# Patient Record
Sex: Male | Born: 1940 | Race: White | Hispanic: No | Marital: Married | Smoking: Former smoker
Health system: Southern US, Community
[De-identification: ages and names within clinical notes are randomized; demographics above are authoritative.]

## PROBLEM LIST (undated history)

## (undated) DIAGNOSIS — C679 Malignant neoplasm of bladder, unspecified: Secondary | ICD-10-CM

## (undated) DIAGNOSIS — I714 Abdominal aortic aneurysm, without rupture, unspecified: Secondary | ICD-10-CM

## (undated) DIAGNOSIS — R609 Edema, unspecified: Secondary | ICD-10-CM

## (undated) DIAGNOSIS — E782 Mixed hyperlipidemia: Secondary | ICD-10-CM

## (undated) DIAGNOSIS — I4891 Unspecified atrial fibrillation: Secondary | ICD-10-CM

## (undated) DIAGNOSIS — I739 Peripheral vascular disease, unspecified: Secondary | ICD-10-CM

## (undated) DIAGNOSIS — J449 Chronic obstructive pulmonary disease, unspecified: Secondary | ICD-10-CM

## (undated) DIAGNOSIS — I10 Essential (primary) hypertension: Secondary | ICD-10-CM

## (undated) HISTORY — PX: TRANSURETHRAL RESECTION OF PROSTATE: SHX73

## (undated) HISTORY — DX: Essential (primary) hypertension: I10

## (undated) HISTORY — DX: Malignant neoplasm of bladder, unspecified: C67.9

## (undated) HISTORY — DX: Abdominal aortic aneurysm, without rupture: I71.4

## (undated) HISTORY — DX: Mixed hyperlipidemia: E78.2

## (undated) HISTORY — PX: TONSILLECTOMY: SUR1361

## (undated) HISTORY — DX: Peripheral vascular disease, unspecified: I73.9

## (undated) HISTORY — DX: Unspecified atrial fibrillation: I48.91

## (undated) HISTORY — DX: Abdominal aortic aneurysm, without rupture, unspecified: I71.40

---

## 2000-07-11 ENCOUNTER — Other Ambulatory Visit: Admission: RE | Admit: 2000-07-11 | Discharge: 2000-07-11 | Payer: Self-pay | Admitting: Urology

## 2000-08-15 ENCOUNTER — Encounter: Payer: Self-pay | Admitting: Urology

## 2000-08-15 ENCOUNTER — Ambulatory Visit (HOSPITAL_COMMUNITY): Admission: RE | Admit: 2000-08-15 | Discharge: 2000-08-15 | Payer: Self-pay | Admitting: Urology

## 2000-10-16 ENCOUNTER — Ambulatory Visit (HOSPITAL_COMMUNITY): Admission: RE | Admit: 2000-10-16 | Discharge: 2000-10-16 | Payer: Self-pay | Admitting: Urology

## 2001-02-13 ENCOUNTER — Other Ambulatory Visit: Admission: RE | Admit: 2001-02-13 | Discharge: 2001-02-13 | Payer: Self-pay | Admitting: Urology

## 2001-04-02 ENCOUNTER — Ambulatory Visit (HOSPITAL_COMMUNITY): Admission: AD | Admit: 2001-04-02 | Discharge: 2001-04-02 | Payer: Self-pay | Admitting: Urology

## 2002-01-28 ENCOUNTER — Ambulatory Visit (HOSPITAL_COMMUNITY): Admission: RE | Admit: 2002-01-28 | Discharge: 2002-01-28 | Payer: Self-pay | Admitting: Urology

## 2002-03-05 ENCOUNTER — Encounter: Payer: Self-pay | Admitting: Family Medicine

## 2002-03-05 ENCOUNTER — Ambulatory Visit (HOSPITAL_COMMUNITY): Admission: RE | Admit: 2002-03-05 | Discharge: 2002-03-05 | Payer: Self-pay | Admitting: Family Medicine

## 2004-06-16 ENCOUNTER — Ambulatory Visit (HOSPITAL_COMMUNITY): Admission: RE | Admit: 2004-06-16 | Discharge: 2004-06-16 | Payer: Self-pay | Admitting: Family Medicine

## 2004-06-22 ENCOUNTER — Ambulatory Visit: Payer: Self-pay

## 2005-09-19 ENCOUNTER — Ambulatory Visit (HOSPITAL_COMMUNITY): Admission: RE | Admit: 2005-09-19 | Discharge: 2005-09-19 | Payer: Self-pay | Admitting: Family Medicine

## 2010-02-15 ENCOUNTER — Ambulatory Visit (HOSPITAL_COMMUNITY)
Admission: RE | Admit: 2010-02-15 | Discharge: 2010-02-15 | Payer: Self-pay | Source: Home / Self Care | Attending: Internal Medicine | Admitting: Internal Medicine

## 2010-06-18 NOTE — H&P (Signed)
[facility]  Patient:    Jesse Gallagher, Jesse Gallagher Visit Number: [id number] MRN: [id number]          Service Type: DSU Location: DAY Attending Physician:  Dennie Maizes Dictated by:   Dennie Maizes, M.D. Admit Date:  04/02/2001 Discharge Date: 04/02/2001   CC:         Karleen Hampshire, M.D.   History and Physical  CHIEF COMPLAINT:  Recurrent bladder tumor.  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  He has been treated with intravesical BCG.  Cystoscopy, multiple bladder biopsies and TUR of the bladder tumor was done in September 2002.  He has done well after the surgery.  He is not having any voiding difficulty or gross hematuria at present.  Urine cytology revealed no evidence of malignancy.  Cystoscopy in the office revealed recurrent bladder tumor in the right ureteral orifice. The patient is brought to the hospital today for cystoscopy, multiple bladder biopsies and transurethral resection of the bladder tumor.  PAST MEDICAL HISTORY: 1. 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 2. History of essential hypertension. 3. Status post tonsillectomy.  MEDICATIONS:  Ziac 2.5 mg one p.o. q.d.  ALLERGIES:  None.  FAMILY HISTORY:  Positive for coronary artery disease and urolithiasis.  PHYSICAL EXAMINATION:  HEENT:  Normal.  NECK:  No masses.  LUNGS:  Clear to auscultation.  HEART:  Regular rate and rhythm, no murmurs.  ABDOMEN:  Soft, nonpalpable mass.  No costovertebral angle tenderness. Bladder is not palpable.  GENITALIA:  Penis and testes are normal.  IMPRESSION: 1. Recurrent bladder tumor. 2. History of carcinoma of the bladder.  PLAN:  Cystoscopy, multiple bladder biopsies, and transurethral resection of the bladder tumor under anesthesia in the hospital.  I have discussed with  the patient regarding the diagnosis, operative details, alternative treatments, also possible risks and complications and is agreed for the procedure to be done. Dictated by:   Dennie Maizes, M.D. Attending Physician:  Dennie Maizes DD:  04/01/01 TD:  04/01/01 Job: [id number]

## 2010-06-18 NOTE — Op Note (Signed)
NAME:  Jesse Gallagher, Jesse Gallagher                        ACCOUNT NO.:  [id number]   MEDICAL RECORD NO.:  [id number]                   PATIENT TYPE:  AMB   LOCATION:  DAY                                  FACILITY:  [facility]   PHYSICIAN:  Dennie Maizes, M.D.                DATE OF BIRTH:  Apr 19, 1940   DATE OF PROCEDURE:  01/28/2002  DATE OF DISCHARGE:                                 OPERATIVE REPORT   PREOPERATIVE DIAGNOSES:  Recurrent bladder tumors, carcinoma of the bladder.   POSTOPERATIVE DIAGNOSES:  Recurrent bladder tumors, carcinoma of the  bladder.   PROCEDURE:  Cystoscopy, multiple bladder biopsies and transverse of  secondary bladder tumor (2 cm).   SURGEON:  Dennie Maizes, M.D.   ANESTHESIA:  Spinal   COMPLICATIONS:  None   ESTIMATED BLOOD LOSS:  Minimal   SPECIMENS:  Bladder biopsies and bladder tumor.   DRAINS:  11 French Foley catheter in the bladder.   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  He had  bladder tumor recurrences during a recent cystoscopy.  He was taken to the  operating room today for cystoscopy, multiple bladder biopsies and  transverse resection of bladder tumor.   DESCRIPTION OF PROCEDURE:  Spinal anesthesia was induced and the patient was  placed on the OR table in the dorsal lithotomy position.  The lower abdomen  and genitalia were prepped and draped in a sterile fashion.  Cystoscopy was  done with the 25 [place] scope.  The urethra was normal.  There was moderate  hypertrophy involving both left lobes of the prostate without significant  bladder neck obstruction.  The bladder was then examined.  There was a 2-cm  sized papillary tumor surrounding the right ureteral orifice.  There were no  other abnormalities inside the bladder.  With rigid biopsy forceps mucosal  biopsies are done from the right lateral wall, posterior wall, left lateral  wall and  trigone of the bladder.  The cystoscope was then removed. The  urethral was dilated up to 30 [place] with [employer] sound.  A 28 French  Iglesias resectoscope with continuous bladder irrigation was then inserted  into the bladder.  Biopsy sites were first examined and fulgurated.  The  bladder tumor was then resected up to the muscle base.  Care was taken not  to fulgurate the ureteral orifice.  Fulguration of the peripheral part of  the tumor was done to obtain hemostasis.  There was no active bleeding at  this time.  The tumor pieces were removed and sent for histopathological  examination.  The instruments were removed.  An 65 French Foley catheter was  inserted into the bladder.  Bladder irrigation was done with saline and  irrigation was clear.  The patient was transferred to the [facility] in a  satisfactory condition.  Dennie Maizes, M.D.    SK/MEDQ  D:  01/28/2002  T:  01/28/2002  Job:  [id number]   cc:   Kirk Ruths, M.D.  [address]  [place]  [place] [address]  Fax: [phone]

## 2010-06-18 NOTE — H&P (Signed)
   NAME:  Jesse Gallagher, Jesse Gallagher                        ACCOUNT NO.:  [id number]   MEDICAL RECORD NO.:  [id number]                   PATIENT TYPE:  AMB   LOCATION:  DAY                                  FACILITY:  [facility]   PHYSICIAN:  Dennie Maizes, M.D.                DATE OF BIRTH:  06-08-1940   DATE OF ADMISSION:  01/28/2002  DATE OF DISCHARGE:                                HISTORY & PHYSICAL   CHIEF COMPLAINT:  Recurrent bladder tumors.   HISTORY OF PRESENT ILLNESS:  This 70 year old male has a history of  recurrent superficial carcinoma of the bladder.  He was initially evaluated  for gross hematuria in March 2001; there were bladder tumors.  He has  undergone multiple bladder biopsies and resection of bladder tumor.  Last  bladder tumor recurrence was in March 2003.  He also has been treated with  interval cycle of BCG.  He underwent follow up cystoscopy in October 2003 at  which time recurrent bladder tumors were noted.  The patient denies having  any voiding difficulty or gross hematuria at present.  His recent urine  cytology revealed no evidence of malignant cells.  The patient is brought to  the hospital today for a cystoscopy, multiple bladder biopsies and  transurethral resection of bladder tumors.   MEDICATIONS:  Ziac 2.5 mg p.o. q.d.   ALLERGIES:  None.   PAST MEDICAL HISTORY:  1. History of recurrent superficial transitional carcinoma of the bladder.  2. History of essential hypertension.  3. Status post tonsillectomy.   FAMILY HISTORY:  Positive for coronary artery disease and kidney stones.   PHYSICAL EXAMINATION:  HEAD, EYES, EARS, NOSE, AND THROAT:  Normal.  NECK:  No masses.  LUNGS:  Clear to auscultation.  HEART:  Regular rate and rhythm.  No murmurs.  ABDOMEN:  Soft, no palpable recurrent mass. No costovertebral angle  tenderness.  Bladder is not palpable.  Kidneys and testes are normal.  RECTAL EXAMINATION:  Fifty-gram size benign prostate.   IMPRESSION:   Recurrent superficial bladder tumors.   PLAN:  Cystoscopy, multiple bladder biopsies and transurethral resection of  bladder tumors under anesthesia in the hospital.   I have discussed with the patient regarding the diagnosis, operative  details, alternative treatments, outcome, and possible risks and  complications; and, he has agreed for the procedure to be done.                                                 Dennie Maizes, M.D.    SK/MEDQ  D:  01/27/2002  T:  01/27/2002  Job:  [id number]   cc:   Kirk Ruths, M.D.  [address]  [place]  [place] [address]  Fax: [phone]

## 2010-06-18 NOTE — Op Note (Signed)
[facility]  Patient:    Jesse Gallagher, Jesse Gallagher Visit Number: [id number] MRN: [id number]          Service Type: DSU Location: DAY Attending Physician:  Dennie Maizes Dictated by:   Dennie Maizes, M.D. Proc. Date: 04/02/01 Admit Date:  04/02/2001   CC:         Karleen Hampshire, M.D.   Operative Report  PREOPERATIVE DIAGNOSIS:  Recurrent bladder tumor.  POSTOPERATIVE DIAGNOSIS:  Recurrent bladder tumor.  OPERATION:  Cystoscopy with multiple bladder biopsies and transurethral resection of bladder tumor, size 1.5 cm.  SURGEON:  Dennie Maizes, M.D.  ANESTHESIA:  General.  COMPLICATIONS:  None.  ESTIMATED BLOOD LOSS:  Minimal.  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  Repeat cystoscopy in the office revealed a bladder tumor recurrence.  The patient was taken to the OR today for cystoscopy, multiple bladder biopsies and transurethral resection of the bladder tumor.  DESCRIPTION OF PROCEDURE:  Spinal anesthesia was induced, and the patient was placed on the operating table in the dorsal lithotomy position.  The lower abdomen and genitalia were prepped and draped in a sterile fashion. Cystoscopy was done with a 24 [place] scope.  The uterine prostate was normal. The bladder was then examined.  There was papillary tumor about 1.5 cm in size around the right ureteral orifice.  The rest of the bladder mucosa was normal. With the rigid biopsy forceps, mucosal biopsies were done from the right lateral wall, posterior wall, left lateral wall, and trigone of the bladder. The cystoscope was removed.  The ureter was dilated up to 30 [place] with [employer] sounds.  A 28 [place] Iglesias resectoscope with continuous bladder insufflation was then inserted into the bladder.  The biopsy sites were first examined and fulgurated.  The tumor around the right ureteral orifice was then  resected up to the muscle bellies.  The ureteral orifice could be seen clearly in the center of the base of the tumor.  There was efflux of clear urine from the ureteral orifice.  The bleeding points were fulgurated and complete hemostasis was obtained.  The tumor pieces were obtained and sent for histopathological examination.  The instruments were removed.  An 67 French Foley catheter was inserted into the bladder.  The patient was transferred to the PACU in satisfactory condition. Dictated by:   Dennie Maizes, M.D. Attending Physician:  Dennie Maizes DD:  04/02/01 TD:  04/02/01 Job: [id number]

## 2012-02-08 ENCOUNTER — Encounter (HOSPITAL_COMMUNITY): Payer: Self-pay | Admitting: Pharmacy Technician

## 2012-02-08 NOTE — Patient Instructions (Addendum)
Your procedure is scheduled on:  02/14/2012  Report to [name] at 6:15     AM.  Call this number if you have problems the morning of surgery: [phone]   Remember:   Do not eat or drink :After Midnight.    Take these medicines the morning of surgery with A SIP OF WATER:Use Albuterol inhaler, take  Ziac, Spiriva, Prednisone   Do not wear jewelry, make-up or nail polish.  Do not wear lotions, powders, or perfumes. You may wear deodorant.  Do not shave 48 hours prior to surgery.  Do not bring valuables to the hospital.  Contacts, dentures or bridgework may not be worn into surgery.  Patients discharged the day of surgery will not be allowed to drive home.  Name and phone number of your driver:    Please read over the following fact sheets that you were given: Pain Booklet, Surgical Site Infection Prevention, Anesthesia Post-op Instructions and Care and Recovery After Surgery  Cataract Surgery  A cataract is a clouding of the lens of the eye. When a lens becomes cloudy, vision is reduced based on the degree and nature of the clouding. Surgery may be needed to improve vision. Surgery removes the cloudy lens and usually replaces it with a substitute lens (intraocular lens, IOL). LET YOUR EYE DOCTOR KNOW ABOUT:  Allergies to food or medicine.   Medicines taken including herbs, eyedrops, over-the-counter medicines, and creams.   Use of steroids (by mouth or creams).   Previous problems with anesthetics or numbing medicine.   History of bleeding problems or blood clots.   Previous surgery.   Other health problems, including diabetes and kidney problems.   Possibility of pregnancy, if this applies.  RISKS AND COMPLICATIONS  Infection.   Inflammation of the eyeball (endophthalmitis) that can spread to both eyes (sympathetic ophthalmia).   Poor wound healing.   If an IOL is inserted, it can later fall out of proper position. This is very uncommon.   Clouding of the part of  your eye that holds an IOL in place. This is called an "after-cataract." These are uncommon, but easily treated.  BEFORE THE PROCEDURE  Do not eat or drink anything except small amounts of water for 8 to 12 before your surgery, or as directed by your caregiver.   Unless you are told otherwise, continue any eyedrops you have been prescribed.   Talk to your primary caregiver about all other medicines that you take (both prescription and non-prescription). In some cases, you may need to stop or change medicines near the time of your surgery. This is most important if you are taking blood-thinning medicine.Do not stop medicines unless you are told to do so.   Arrange for someone to drive you to and from the procedure.   Do not put contact lenses in either eye on the day of your surgery.  PROCEDURE There is more than one method for safely removing a cataract. Your doctor can explain the differences and help determine which is best for you. Phacoemulsification surgery is the most common form of cataract surgery.  An injection is given behind the eye or eyedrops are given to make this a painless procedure.   A small cut (incision) is made on the edge of the clear, dome-shaped surface that covers the front of the eye (cornea).   A tiny probe is painlessly inserted into the eye. This device gives off ultrasound waves that soften and break up the cloudy center of the  lens. This makes it easier for the cloudy lens to be removed by suction.   An IOL may be implanted.   The normal lens of the eye is covered by a clear capsule. Part of that capsule is intentionally left in the eye to support the IOL.   Your surgeon may or may not use stitches to close the incision.  There are other forms of cataract surgery that require a larger incision and stiches to close the eye. This approach is taken in cases where the doctor feels that the cataract cannot be easily removed using phacoemulsification. AFTER THE  PROCEDURE  When an IOL is implanted, it does not need care. It becomes a permanent part of your eye and cannot be seen or felt.   Your doctor will schedule follow-up exams to check on your progress.   Review your other medicines with your doctor to see which can be resumed after surgery.   Use eyedrops or take medicine as prescribed by your doctor.  Document Released: 01/06/2011 Document Reviewed: 01/03/2011 [facility] Patient Information 2012 [place].  .Cataract Surgery Care After Refer to this sheet in the next few weeks. These instructions provide you with information on caring for yourself after your procedure. Your caregiver may also give you more specific instructions. Your treatment has been planned according to current medical practices, but problems sometimes occur. Call your caregiver if you have any problems or questions after your procedure.  HOME CARE INSTRUCTIONS   Avoid strenuous activities as directed by your caregiver.   Ask your caregiver when you can resume driving.   Use eyedrops or other medicines to help healing and control pressure inside your eye as directed by your caregiver.   Only take over-the-counter or prescription medicines for pain, discomfort, or fever as directed by your caregiver.   Do not to touch or rub your eyes.   You may be instructed to use a protective shield during the first few days and nights after surgery. If not, wear sunglasses to protect your eyes. This is to protect the eye from pressure or from being accidentally bumped.   Keep the area around your eye clean and dry. Avoid swimming or allowing water to hit you directly in the face while showering. Keep soap and shampoo out of your eyes.   Do not bend or lift heavy objects. Bending increases pressure in the eye. You can walk, climb stairs, and do light household chores.   Do not put a contact lens into the eye that had surgery until your caregiver says it is okay to do so.    Ask your doctor when you can return to work. This will depend on the kind of work that you do. If you work in a dusty environment, you may be advised to wear protective eyewear for a period of time.   Ask your caregiver when it will be safe to engage in sexual activity.   Continue with your regular eye exams as directed by your caregiver.  What to expect:  It is normal to feel itching and mild discomfort for a few days after cataract surgery. Some fluid discharge is also common, and your eye may be sensitive to light and touch.   After 1 to 2 days, even moderate discomfort should disappear. In most cases, healing will take about 6 weeks.   If you received an intraocular lens (IOL), you may notice that colors are very bright or have a blue tinge. Also, if you have been  in bright sunlight, everything may appear reddish for a few hours. If you see these color tinges, it is because your lens is clear and no longer cloudy. Within a few months after receiving an IOL, these extra colors should go away. When you have healed, you will probably need new glasses.  SEEK MEDICAL CARE IF:   You have increased bruising around your eye.   You have discomfort not helped by medicine.  SEEK IMMEDIATE MEDICAL CARE IF:   You have a fever.   You have a worsening or sudden vision loss.   You have redness, swelling, or increasing pain in the eye.   You have a thick discharge from the eye that had surgery.  MAKE SURE YOU:  Understand these instructions.   Will watch your condition.   Will get help right away if you are not doing well or get worse.  Document Released: 08/06/2004 Document Revised: 01/06/2011 Document Reviewed: 09/10/2010 [facility] Patient Information 2012 [place].

## 2012-02-09 ENCOUNTER — Encounter (HOSPITAL_COMMUNITY)
Admission: RE | Admit: 2012-02-09 | Discharge: 2012-02-09 | Disposition: A | Discharge status: Home / Self Care | Payer: Medicare Other | Source: Ambulatory Visit | Attending: Ophthalmology | Admitting: Ophthalmology

## 2012-02-09 ENCOUNTER — Encounter (HOSPITAL_COMMUNITY): Payer: Self-pay

## 2012-02-09 HISTORY — DX: Chronic obstructive pulmonary disease, unspecified: J44.9

## 2012-02-09 LAB — BASIC METABOLIC PANEL
BUN: 33 mg/dL — ABNORMAL HIGH (ref 6–23)
Creatinine, Ser: 1.56 mg/dL — ABNORMAL HIGH (ref 0.50–1.35)
GFR calc Af Amer: 49 mL/min — ABNORMAL LOW (ref >90)
GFR calc non Af Amer: 42 mL/min — ABNORMAL LOW (ref >90)

## 2012-02-09 NOTE — Pre-Procedure Instructions (Signed)
Patient in for PAT visit. EKG shows AFib. Called Dr Brita Romp. No previous history of AFib.Patient given copy of EKG and sent straight to see Dr Juanetta Gosling. Dr Juanetta Gosling to let us know whether to procede with surgery or not. Dr Jayme Cloud also aware.

## 2012-02-13 MED ORDER — CYCLOPENTOLATE-PHENYLEPHRINE 0.2-1 % OP SOLN
OPHTHALMIC | Status: AC
  Filled 2012-02-13: qty 2

## 2012-02-13 MED ORDER — TETRACAINE HCL 0.5 % OP SOLN
OPHTHALMIC | Status: AC
  Filled 2012-02-13: qty 2

## 2012-02-13 MED ORDER — PHENYLEPHRINE HCL 2.5 % OP SOLN
OPHTHALMIC | Status: AC
  Filled 2012-02-13: qty 2

## 2012-02-13 MED ORDER — FLURBIPROFEN SODIUM 0.03 % OP SOLN
OPHTHALMIC | Status: AC
  Filled 2012-02-13: qty 2.5

## 2012-02-13 NOTE — OR Nursing (Signed)
Per pt. He saw Dr. Juanetta Gosling and he looked at his ekg. Per pt. He said it was ok to proceed with surgery. Called Dr. Juanetta Gosling office and informed that we need a note to proceed with surgery. Dr. Juanetta Gosling to fax a note to Day Surgery. [phone]

## 2012-02-14 ENCOUNTER — Encounter (HOSPITAL_COMMUNITY)
Admission: RE | Disposition: A | Discharge status: Home / Self Care | Payer: Self-pay | Source: Ambulatory Visit | Attending: Ophthalmology

## 2012-02-14 ENCOUNTER — Encounter (HOSPITAL_COMMUNITY): Payer: Self-pay | Admitting: Anesthesiology

## 2012-02-14 ENCOUNTER — Ambulatory Visit (HOSPITAL_COMMUNITY)
Admission: RE | Admit: 2012-02-14 | Discharge: 2012-02-14 | Disposition: A | Discharge status: Home / Self Care | Payer: Medicare Other | Source: Ambulatory Visit | Attending: Ophthalmology | Admitting: Ophthalmology

## 2012-02-14 ENCOUNTER — Encounter (HOSPITAL_COMMUNITY): Payer: Self-pay | Admitting: *Deleted

## 2012-02-14 ENCOUNTER — Ambulatory Visit (HOSPITAL_COMMUNITY): Payer: Medicare Other | Admitting: Anesthesiology

## 2012-02-14 DIAGNOSIS — I10 Essential (primary) hypertension: Secondary | ICD-10-CM | POA: Insufficient documentation

## 2012-02-14 DIAGNOSIS — J4489 Other specified chronic obstructive pulmonary disease: Secondary | ICD-10-CM | POA: Insufficient documentation

## 2012-02-14 DIAGNOSIS — Z01812 Encounter for preprocedural laboratory examination: Secondary | ICD-10-CM | POA: Insufficient documentation

## 2012-02-14 DIAGNOSIS — H2510 Age-related nuclear cataract, unspecified eye: Secondary | ICD-10-CM | POA: Insufficient documentation

## 2012-02-14 DIAGNOSIS — J449 Chronic obstructive pulmonary disease, unspecified: Secondary | ICD-10-CM | POA: Insufficient documentation

## 2012-02-14 HISTORY — PX: CATARACT EXTRACTION W/PHACO: SHX586

## 2012-02-14 SURGERY — PHACOEMULSIFICATION, CATARACT, WITH IOL INSERTION
Anesthesia: Monitor Anesthesia Care | Site: Eye | Laterality: Right | Wound class: Clean

## 2012-02-14 MED ORDER — METOPROLOL TARTRATE 1 MG/ML IV SOLN
INTRAVENOUS | Status: AC
  Filled 2012-02-14: qty 5

## 2012-02-14 MED ORDER — MIDAZOLAM HCL 2 MG/2ML IJ SOLN
INTRAMUSCULAR | Status: AC
  Filled 2012-02-14: qty 2

## 2012-02-14 MED ORDER — CYCLOPENTOLATE-PHENYLEPHRINE 0.2-1 % OP SOLN
1.0000 [drp] | OPHTHALMIC | Status: AC
  Administered 2012-02-14 (×3): 1 [drp] via OPHTHALMIC

## 2012-02-14 MED ORDER — METOPROLOL TARTRATE 1 MG/ML IV SOLN
5.0000 mg | Freq: Once | INTRAVENOUS | Status: AC
  Administered 2012-02-14: 5 mg via INTRAVENOUS

## 2012-02-14 MED ORDER — PHENYLEPHRINE HCL 2.5 % OP SOLN
1.0000 [drp] | OPHTHALMIC | Status: AC
  Administered 2012-02-14 (×3): 1 [drp] via OPHTHALMIC

## 2012-02-14 MED ORDER — ONDANSETRON HCL 4 MG/2ML IJ SOLN: 4.0000 mg | Freq: Once | INTRAMUSCULAR | Status: DC | PRN

## 2012-02-14 MED ORDER — EPINEPHRINE HCL 1 MG/ML IJ SOLN
INTRAOCULAR | Status: DC | PRN
  Administered 2012-02-14: 08:00:00

## 2012-02-14 MED ORDER — PROVISC 10 MG/ML IO SOLN
INTRAOCULAR | Status: DC | PRN
  Administered 2012-02-14: 8.5 mg via INTRAOCULAR

## 2012-02-14 MED ORDER — FENTANYL CITRATE 0.05 MG/ML IJ SOLN: 25.0000 ug | INTRAMUSCULAR | Status: DC | PRN

## 2012-02-14 MED ORDER — LACTATED RINGERS IV SOLN
INTRAVENOUS | Status: DC | PRN
  Administered 2012-02-14: 07:00:00 via INTRAVENOUS

## 2012-02-14 MED ORDER — MIDAZOLAM HCL 2 MG/2ML IJ SOLN
1.0000 mg | INTRAMUSCULAR | Status: DC | PRN
  Administered 2012-02-14: 2 mg via INTRAVENOUS

## 2012-02-14 MED ORDER — TETRACAINE HCL 0.5 % OP SOLN
1.0000 [drp] | OPHTHALMIC | Status: AC
  Administered 2012-02-14 (×3): 1 [drp] via OPHTHALMIC

## 2012-02-14 MED ORDER — LABETALOL HCL 5 MG/ML IV SOLN
INTRAVENOUS | Status: AC
  Filled 2012-02-14: qty 4

## 2012-02-14 MED ORDER — FLURBIPROFEN SODIUM 0.03 % OP SOLN
1.0000 [drp] | OPHTHALMIC | Status: AC
  Administered 2012-02-14 (×3): 1 [drp] via OPHTHALMIC

## 2012-02-14 MED ORDER — LACTATED RINGERS IV SOLN
INTRAVENOUS | Status: DC
  Administered 2012-02-14: 1000 mL via INTRAVENOUS

## 2012-02-14 MED ORDER — BSS IO SOLN
INTRAOCULAR | Status: DC | PRN
  Administered 2012-02-14: 15 mL via INTRAOCULAR

## 2012-02-14 SURGICAL SUPPLY — 23 items

## 2012-02-14 NOTE — Progress Notes (Signed)
Dr Jayme Cloud in to assess pt. May be discharged now--per Dr Jayme Cloud. Pt to see Dr Juanetta Gosling and have echocardiogram this week.

## 2012-02-14 NOTE — Anesthesia Preprocedure Evaluation (Signed)
Anesthesia Evaluation  Patient identified by MRN, date of birth, ID band Patient awake    Reviewed: Allergy & Precautions, H&P , NPO status , Patient's Chart, lab work & pertinent test results  Airway Mallampati: I      Dental   Pulmonary COPD COPD inhaler,  breath sounds clear to auscultation        Cardiovascular hypertension, PND: new onset. + dysrhythmias Atrial Fibrillation Rhythm:Irregular Rate:Tachycardia     Neuro/Psych    GI/Hepatic   Endo/Other    Renal/GU      Musculoskeletal   Abdominal   Peds  Hematology   Anesthesia Other Findings   Reproductive/Obstetrics                           Anesthesia Physical Anesthesia Plan  ASA: III  Anesthesia Plan: MAC   Post-op Pain Management:    Induction: Intravenous  Airway Management Planned: Nasal Cannula  Additional Equipment:   Intra-op Plan:   Post-operative Plan:   Informed Consent: I have reviewed the patients History and Physical, chart, labs and discussed the procedure including the risks, benefits and alternatives for the proposed anesthesia with the patient or authorized representative who has indicated his/her understanding and acceptance.     Plan Discussed with:   Anesthesia Plan Comments:         Anesthesia Quick Evaluation

## 2012-02-14 NOTE — Preoperative (Addendum)
Beta Blockers   Reason not to administer Beta Blockers:Lopressor 5mg  IV at 0700.

## 2012-02-14 NOTE — Progress Notes (Signed)
Dr. Juanetta Gosling notified of persistant atrial fibrillationr rate in the 100's with hypertension. Dr. Jayme Cloud aware with medication(Lopressor) Given in pre-op as ordered and instructions to notifiy Dr. Juanetta Gosling. No further orders given by Dr. Juanetta Gosling. OK to proceed with surgery.

## 2012-02-14 NOTE — Anesthesia Procedure Notes (Signed)
Procedure Name: MAC Performed by: Carolyne Littles, AMY L Pre-anesthesia Checklist: Patient identified, Emergency Drugs available, Suction available, Timeout performed and Patient being monitored Patient Re-evaluated:Patient Re-evaluated prior to inductionOxygen Delivery Method: Nasal Cannula

## 2012-02-14 NOTE — Transfer of Care (Signed)
  Anesthesia Post-op Note  Patient: Jesse Gallagher  Procedure(s) Performed: Procedure(s) (LRB) with comments: CATARACT EXTRACTION PHACO AND INTRAOCULAR LENS PLACEMENT (IOC) (Right) - CDE:13.14  Patient Location: Short stay  Anesthesia Type: MAC  Level of Consciousness: awake, alert , oriented and patient cooperative  Airway and Oxygen Therapy: Patient Spontanous Breathing room air  Post-op Pain: mild  Post-op Assessment: Post-op Vital signs reviewed, Patient's Cardiovascular Status Stable, Respiratory Function Stable, Patent Airway and No signs of Nausea or vomiting  Post-op Vital Signs: Reviewed and stable  Complications: No apparent anesthesia complications

## 2012-02-14 NOTE — H&P (Signed)
The patient was re examined and there is no change in the patients condition since the original H and P. 

## 2012-02-14 NOTE — Anesthesia Postprocedure Evaluation (Signed)
  Anesthesia Post-op Note  Patient: Jesse Gallagher  Procedure(s) Performed: Procedure(s) (LRB) with comments: CATARACT EXTRACTION PHACO AND INTRAOCULAR LENS PLACEMENT (IOC) (Right) - CDE:13.14  Patient Location: Short stay  Anesthesia Type: MAC  Level of Consciousness: awake, alert , oriented and patient cooperative  Airway and Oxygen Therapy: Patient Spontanous Breathing room air  Post-op Pain: mild  Post-op Assessment: Post-op Vital signs reviewed, Patient's Cardiovascular Status Stable, Respiratory Function Stable, Patent Airway and No signs of Nausea or vomiting  Post-op Vital Signs: Reviewed and stable  Complications: No apparent anesthesia complications  

## 2012-02-14 NOTE — Op Note (Signed)
Patient brought to the operating room and prepped and draped in the usual manner.  Lid speculum inserted in right eye.  Stab incision made at the twelve o'clock position.  Provisc instilled in the anterior chamber.   A 2.4 mm. Stab incision was made temporally.  An anterior capsulotomy was done with a bent 25 gauge needle.  The nucleus was hydrodissected.  The Phaco tip was inserted in the anterior chamber and the nucleus was emulsified.  CDE was 13.14.  The cortical material was then removed with the I and A tip.  Posterior capsule was the polished.  The anterior chamber was deepened with Provisc.  A 16.0 Diopter Rayner 570C IOL was then inserted in the capsular bag.  Provisc was then removed with the I and A tip.  The wound was then hydrated.  Patient sent to the Recovery Room in good condition with follow up in my office.

## 2012-02-16 ENCOUNTER — Encounter (HOSPITAL_COMMUNITY): Payer: Self-pay | Admitting: Ophthalmology

## 2012-02-16 ENCOUNTER — Ambulatory Visit (HOSPITAL_COMMUNITY)
Admission: RE | Admit: 2012-02-16 | Discharge: 2012-02-16 | Disposition: A | Discharge status: Home / Self Care | Payer: Medicare Other | Source: Ambulatory Visit | Attending: Pulmonary Disease | Admitting: Pulmonary Disease

## 2012-02-16 DIAGNOSIS — I4891 Unspecified atrial fibrillation: Secondary | ICD-10-CM

## 2012-02-16 DIAGNOSIS — I10 Essential (primary) hypertension: Secondary | ICD-10-CM | POA: Insufficient documentation

## 2012-02-23 ENCOUNTER — Encounter (HOSPITAL_COMMUNITY): Payer: Self-pay | Admitting: Pharmacy Technician

## 2012-02-24 ENCOUNTER — Encounter (HOSPITAL_COMMUNITY): Payer: Self-pay

## 2012-02-24 ENCOUNTER — Encounter (HOSPITAL_COMMUNITY)
Admission: RE | Admit: 2012-02-24 | Discharge: 2012-02-24 | Disposition: A | Discharge status: Home / Self Care | Payer: Medicare Other | Source: Ambulatory Visit | Attending: Ophthalmology | Admitting: Ophthalmology

## 2012-02-24 NOTE — Patient Instructions (Signed)
Jesse Gallagher  02/24/2012   Your procedure is scheduled on:  02/28/2012  Report to [facility] at 7:00 AM.  Call this number if you have problems the morning of surgery: [phone]   Remember:   Do not eat food or drink liquids after midnight.   Take these medicines the morning of surgery with A SIP OF WATER: Cardizem and inhaler   Do not wear jewelry, make-up or nail polish.  Do not wear lotions, powders, or perfumes. You may wear deodorant.  Do not shave 48 hours prior to surgery. Men may shave face and neck.  Do not bring valuables to the hospital.  Contacts, dentures or bridgework may not be worn into surgery.  Leave suitcase in the car. After surgery it may be brought to your room.  For patients admitted to the hospital, checkout time is 11:00 AM the day of  discharge.   Patients discharged the day of surgery will not be allowed to drive  home.  Name and phone number of your driver: wife  Special Instructions: N/A   Please read over the following fact sheets that you were given: Care and Recovery After Surgery

## 2012-02-27 MED ORDER — TETRACAINE HCL 0.5 % OP SOLN
OPHTHALMIC | Status: AC
  Filled 2012-02-27: qty 2

## 2012-02-27 MED ORDER — FLURBIPROFEN SODIUM 0.03 % OP SOLN
OPHTHALMIC | Status: AC
  Filled 2012-02-27: qty 2.5

## 2012-02-27 MED ORDER — CYCLOPENTOLATE-PHENYLEPHRINE 0.2-1 % OP SOLN
OPHTHALMIC | Status: AC
  Filled 2012-02-27: qty 2

## 2012-02-27 MED ORDER — PHENYLEPHRINE HCL 2.5 % OP SOLN
OPHTHALMIC | Status: AC
  Filled 2012-02-27: qty 2

## 2012-02-28 ENCOUNTER — Encounter (HOSPITAL_COMMUNITY): Payer: Self-pay | Admitting: Anesthesiology

## 2012-02-28 ENCOUNTER — Encounter (HOSPITAL_COMMUNITY): Payer: Self-pay | Admitting: *Deleted

## 2012-02-28 ENCOUNTER — Encounter (HOSPITAL_COMMUNITY)
Admission: RE | Disposition: A | Discharge status: Home / Self Care | Payer: Self-pay | Source: Ambulatory Visit | Attending: Ophthalmology

## 2012-02-28 ENCOUNTER — Ambulatory Visit (HOSPITAL_COMMUNITY)
Admission: RE | Admit: 2012-02-28 | Discharge: 2012-02-28 | Disposition: A | Discharge status: Home / Self Care | Payer: Medicare Other | Source: Ambulatory Visit | Attending: Ophthalmology | Admitting: Ophthalmology

## 2012-02-28 ENCOUNTER — Ambulatory Visit (HOSPITAL_COMMUNITY): Payer: Medicare Other | Admitting: Anesthesiology

## 2012-02-28 DIAGNOSIS — H2510 Age-related nuclear cataract, unspecified eye: Secondary | ICD-10-CM | POA: Insufficient documentation

## 2012-02-28 DIAGNOSIS — J449 Chronic obstructive pulmonary disease, unspecified: Secondary | ICD-10-CM | POA: Insufficient documentation

## 2012-02-28 DIAGNOSIS — J4489 Other specified chronic obstructive pulmonary disease: Secondary | ICD-10-CM | POA: Insufficient documentation

## 2012-02-28 DIAGNOSIS — I10 Essential (primary) hypertension: Secondary | ICD-10-CM | POA: Insufficient documentation

## 2012-02-28 HISTORY — PX: CATARACT EXTRACTION W/PHACO: SHX586

## 2012-02-28 SURGERY — PHACOEMULSIFICATION, CATARACT, WITH IOL INSERTION
Anesthesia: Monitor Anesthesia Care | Site: Eye | Laterality: Left | Wound class: Clean

## 2012-02-28 MED ORDER — FENTANYL CITRATE 0.05 MG/ML IJ SOLN: 25.0000 ug | INTRAMUSCULAR | Status: DC | PRN

## 2012-02-28 MED ORDER — MIDAZOLAM HCL 2 MG/2ML IJ SOLN
INTRAMUSCULAR | Status: AC
  Filled 2012-02-28: qty 2

## 2012-02-28 MED ORDER — TETRACAINE HCL 0.5 % OP SOLN
1.0000 [drp] | OPHTHALMIC | Status: AC
  Administered 2012-02-28 (×3): 1 [drp] via OPHTHALMIC

## 2012-02-28 MED ORDER — ONDANSETRON HCL 4 MG/2ML IJ SOLN: 4.0000 mg | Freq: Once | INTRAMUSCULAR | Status: DC | PRN

## 2012-02-28 MED ORDER — MIDAZOLAM HCL 2 MG/2ML IJ SOLN
1.0000 mg | INTRAMUSCULAR | Status: DC | PRN
  Administered 2012-02-28: 2 mg via INTRAVENOUS

## 2012-02-28 MED ORDER — LACTATED RINGERS IV SOLN
INTRAVENOUS | Status: DC
  Administered 2012-02-28: 1000 mL via INTRAVENOUS

## 2012-02-28 MED ORDER — KETOROLAC TROMETHAMINE 0.5 % OP SOLN: 1.0000 [drp] | OPHTHALMIC | Status: DC

## 2012-02-28 MED ORDER — CYCLOPENTOLATE-PHENYLEPHRINE 0.2-1 % OP SOLN
1.0000 [drp] | OPHTHALMIC | Status: AC
  Administered 2012-02-28 (×3): 1 [drp] via OPHTHALMIC

## 2012-02-28 MED ORDER — BSS IO SOLN
INTRAOCULAR | Status: DC | PRN
  Administered 2012-02-28: 15 mL via INTRAOCULAR

## 2012-02-28 MED ORDER — EPINEPHRINE HCL 1 MG/ML IJ SOLN
INTRAMUSCULAR | Status: AC
  Filled 2012-02-28: qty 1

## 2012-02-28 MED ORDER — PROVISC 10 MG/ML IO SOLN
INTRAOCULAR | Status: DC | PRN
  Administered 2012-02-28: 8.5 mg via INTRAOCULAR

## 2012-02-28 MED ORDER — PHENYLEPHRINE HCL 2.5 % OP SOLN
1.0000 [drp] | OPHTHALMIC | Status: AC
  Administered 2012-02-28 (×3): 1 [drp] via OPHTHALMIC

## 2012-02-28 MED ORDER — EPINEPHRINE HCL 1 MG/ML IJ SOLN
INTRAOCULAR | Status: DC | PRN
  Administered 2012-02-28: 08:00:00

## 2012-02-28 MED ORDER — TETRACAINE 0.5 % OP SOLN OPTIME - NO CHARGE
OPHTHALMIC | Status: DC | PRN
  Administered 2012-02-28: 2 [drp] via OPHTHALMIC

## 2012-02-28 MED ORDER — FLURBIPROFEN SODIUM 0.03 % OP SOLN
1.0000 [drp] | OPHTHALMIC | Status: AC
  Administered 2012-02-28 (×3): 1 [drp] via OPHTHALMIC

## 2012-02-28 SURGICAL SUPPLY — 23 items

## 2012-02-28 NOTE — Op Note (Signed)
Patient brought to the operating room and prepped and draped in the usual manner.  Lid speculum inserted in left eye.  Stab incision made at the twelve o'clock position.  Provisc instilled in the anterior chamber.   A 2.4 mm. Stab incision was made temporally.  An anterior capsulotomy was done with a bent 25 gauge needle.  The nucleus was hydrodissected.  The Phaco tip was inserted in the anterior chamber and the nucleus was emulsified.  CDE was 14.65.  The cortical material was then removed with the I and A tip.  Posterior capsule was the polished.  The anterior chamber was deepened with Provisc.  A 16.0 Diopter Rayner 570C IOL was then inserted in the capsular bag.  Provisc was then removed with the I and A tip.  The wound was then hydrated.  Patient sent to the Recovery Room in good condition with follow up in my office.

## 2012-02-28 NOTE — Anesthesia Preprocedure Evaluation (Signed)
Anesthesia Evaluation  Patient identified by MRN, date of birth, ID band Patient awake    Reviewed: Allergy & Precautions, H&P , NPO status , Patient's Chart, lab work & pertinent test results  Airway Mallampati: I      Dental   Pulmonary COPD COPD inhaler,  breath sounds clear to auscultation        Cardiovascular hypertension, PND: new onset. + dysrhythmias Atrial Fibrillation Rhythm:Irregular Rate:Tachycardia     Neuro/Psych    GI/Hepatic   Endo/Other    Renal/GU      Musculoskeletal   Abdominal   Peds  Hematology   Anesthesia Other Findings   Reproductive/Obstetrics                           Anesthesia Physical Anesthesia Plan  ASA: III  Anesthesia Plan: MAC   Post-op Pain Management:    Induction: Intravenous  Airway Management Planned: Nasal Cannula  Additional Equipment:   Intra-op Plan:   Post-operative Plan:   Informed Consent: I have reviewed the patients History and Physical, chart, labs and discussed the procedure including the risks, benefits and alternatives for the proposed anesthesia with the patient or authorized representative who has indicated his/her understanding and acceptance.     Plan Discussed with:   Anesthesia Plan Comments:         Anesthesia Quick Evaluation  

## 2012-02-28 NOTE — H&P (Signed)
The patient was re examined and there is no change in the patients condition since the original H and P. 

## 2012-02-28 NOTE — Anesthesia Postprocedure Evaluation (Signed)
  Anesthesia Post-op Note  Patient: Jesse Gallagher  Procedure(s) Performed: Procedure(s) (LRB) with comments: CATARACT EXTRACTION PHACO AND INTRAOCULAR LENS PLACEMENT (IOC) (Left) - CDE: 14.65  Patient Location: Short Stay  Anesthesia Type:MAC  Level of Consciousness: awake, alert , oriented and patient cooperative  Airway and Oxygen Therapy: Patient Spontanous Breathing  Post-op Pain: none  Post-op Assessment: Post-op Vital signs reviewed, Patient's Cardiovascular Status Stable, Respiratory Function Stable, Patent Airway, No signs of Nausea or vomiting and Pain level controlled  Post-op Vital Signs: Reviewed and stable  Complications: No apparent anesthesia complications

## 2012-02-28 NOTE — Transfer of Care (Signed)
Immediate Anesthesia Transfer of Care Note  Patient: Jesse Gallagher  Procedure(s) Performed: Procedure(s) (LRB) with comments: CATARACT EXTRACTION PHACO AND INTRAOCULAR LENS PLACEMENT (IOC) (Left) - CDE: 14.65  Patient Location: Short Stay  Anesthesia Type:MAC  Level of Consciousness: awake, alert , oriented and patient cooperative  Airway & Oxygen Therapy: Patient Spontanous Breathing  Post-op Assessment: Report given to PACU RN, Post -op Vital signs reviewed and stable and Patient moving all extremities  Post vital signs: Reviewed and stable  Complications: No apparent anesthesia complications

## 2012-02-29 ENCOUNTER — Encounter (HOSPITAL_COMMUNITY): Payer: Self-pay | Admitting: Ophthalmology

## 2012-03-06 ENCOUNTER — Ambulatory Visit (HOSPITAL_COMMUNITY)
Admission: RE | Admit: 2012-03-06 | Discharge: 2012-03-06 | Disposition: A | Discharge status: Home / Self Care | Payer: Medicare Other | Source: Ambulatory Visit | Attending: Pulmonary Disease | Admitting: Pulmonary Disease

## 2012-03-06 ENCOUNTER — Other Ambulatory Visit (HOSPITAL_COMMUNITY): Payer: Self-pay | Admitting: Pulmonary Disease

## 2012-03-06 DIAGNOSIS — R0602 Shortness of breath: Secondary | ICD-10-CM | POA: Insufficient documentation

## 2012-03-06 DIAGNOSIS — I10 Essential (primary) hypertension: Secondary | ICD-10-CM | POA: Insufficient documentation

## 2012-03-06 DIAGNOSIS — Z87891 Personal history of nicotine dependence: Secondary | ICD-10-CM | POA: Insufficient documentation

## 2012-03-07 ENCOUNTER — Other Ambulatory Visit (HOSPITAL_COMMUNITY): Payer: Self-pay | Admitting: Pulmonary Disease

## 2012-03-07 ENCOUNTER — Ambulatory Visit (HOSPITAL_COMMUNITY)
Admission: RE | Admit: 2012-03-07 | Discharge: 2012-03-07 | Disposition: A | Discharge status: Home / Self Care | Payer: Medicare Other | Source: Ambulatory Visit | Attending: Pulmonary Disease | Admitting: Pulmonary Disease

## 2012-03-07 ENCOUNTER — Encounter (HOSPITAL_COMMUNITY): Payer: Self-pay

## 2012-03-07 DIAGNOSIS — R0602 Shortness of breath: Secondary | ICD-10-CM

## 2012-03-07 DIAGNOSIS — J4489 Other specified chronic obstructive pulmonary disease: Secondary | ICD-10-CM | POA: Insufficient documentation

## 2012-03-07 DIAGNOSIS — J449 Chronic obstructive pulmonary disease, unspecified: Secondary | ICD-10-CM | POA: Insufficient documentation

## 2012-03-07 DIAGNOSIS — R7989 Other specified abnormal findings of blood chemistry: Secondary | ICD-10-CM | POA: Insufficient documentation

## 2012-03-07 MED ORDER — TECHNETIUM TC 99M DIETHYLENETRIAME-PENTAACETIC ACID
35.0000 | Freq: Once | INTRAVENOUS | Status: AC | PRN
  Administered 2012-03-07: 35 via RESPIRATORY_TRACT

## 2012-03-07 MED ORDER — TECHNETIUM TO 99M ALBUMIN AGGREGATED
6.0000 | Freq: Once | INTRAVENOUS | Status: AC | PRN
  Administered 2012-03-07: 6 via INTRAVENOUS

## 2012-04-30 ENCOUNTER — Other Ambulatory Visit (HOSPITAL_COMMUNITY): Payer: Self-pay | Admitting: Pulmonary Disease

## 2012-04-30 DIAGNOSIS — I739 Peripheral vascular disease, unspecified: Secondary | ICD-10-CM

## 2012-05-03 ENCOUNTER — Ambulatory Visit (HOSPITAL_COMMUNITY)
Admission: RE | Admit: 2012-05-03 | Discharge: 2012-05-03 | Disposition: A | Discharge status: Home / Self Care | Payer: Medicare Other | Source: Ambulatory Visit | Attending: Pulmonary Disease | Admitting: Pulmonary Disease

## 2012-05-03 ENCOUNTER — Other Ambulatory Visit (HOSPITAL_COMMUNITY): Payer: Self-pay | Admitting: Pulmonary Disease

## 2012-05-03 DIAGNOSIS — I739 Peripheral vascular disease, unspecified: Secondary | ICD-10-CM

## 2012-05-03 DIAGNOSIS — F17200 Nicotine dependence, unspecified, uncomplicated: Secondary | ICD-10-CM | POA: Insufficient documentation

## 2012-05-03 DIAGNOSIS — I10 Essential (primary) hypertension: Secondary | ICD-10-CM | POA: Insufficient documentation

## 2012-05-24 ENCOUNTER — Other Ambulatory Visit (HOSPITAL_COMMUNITY): Payer: Self-pay | Admitting: Pulmonary Disease

## 2012-05-24 DIAGNOSIS — R160 Hepatomegaly, not elsewhere classified: Secondary | ICD-10-CM

## 2012-05-28 ENCOUNTER — Ambulatory Visit (HOSPITAL_COMMUNITY)
Admission: RE | Admit: 2012-05-28 | Discharge: 2012-05-28 | Disposition: A | Discharge status: Home / Self Care | Payer: Medicare Other | Source: Ambulatory Visit | Attending: Pulmonary Disease | Admitting: Pulmonary Disease

## 2012-05-28 DIAGNOSIS — I714 Abdominal aortic aneurysm, without rupture, unspecified: Secondary | ICD-10-CM | POA: Insufficient documentation

## 2012-05-28 DIAGNOSIS — R160 Hepatomegaly, not elsewhere classified: Secondary | ICD-10-CM | POA: Insufficient documentation

## 2012-05-28 DIAGNOSIS — Z8551 Personal history of malignant neoplasm of bladder: Secondary | ICD-10-CM | POA: Insufficient documentation

## 2012-05-30 ENCOUNTER — Other Ambulatory Visit (HOSPITAL_COMMUNITY): Payer: Self-pay | Admitting: Pulmonary Disease

## 2012-05-30 DIAGNOSIS — R1900 Intra-abdominal and pelvic swelling, mass and lump, unspecified site: Secondary | ICD-10-CM

## 2012-05-31 ENCOUNTER — Encounter (HOSPITAL_COMMUNITY): Payer: Self-pay

## 2012-05-31 ENCOUNTER — Ambulatory Visit (HOSPITAL_COMMUNITY)
Admission: RE | Admit: 2012-05-31 | Discharge: 2012-05-31 | Disposition: A | Discharge status: Home / Self Care | Payer: Medicare Other | Source: Ambulatory Visit | Attending: Pulmonary Disease | Admitting: Pulmonary Disease

## 2012-05-31 DIAGNOSIS — I714 Abdominal aortic aneurysm, without rupture, unspecified: Secondary | ICD-10-CM | POA: Insufficient documentation

## 2012-05-31 DIAGNOSIS — R935 Abnormal findings on diagnostic imaging of other abdominal regions, including retroperitoneum: Secondary | ICD-10-CM | POA: Insufficient documentation

## 2012-05-31 DIAGNOSIS — J4489 Other specified chronic obstructive pulmonary disease: Secondary | ICD-10-CM | POA: Insufficient documentation

## 2012-05-31 DIAGNOSIS — J90 Pleural effusion, not elsewhere classified: Secondary | ICD-10-CM | POA: Insufficient documentation

## 2012-05-31 DIAGNOSIS — R9389 Abnormal findings on diagnostic imaging of other specified body structures: Secondary | ICD-10-CM | POA: Insufficient documentation

## 2012-05-31 DIAGNOSIS — R1900 Intra-abdominal and pelvic swelling, mass and lump, unspecified site: Secondary | ICD-10-CM

## 2012-05-31 DIAGNOSIS — J449 Chronic obstructive pulmonary disease, unspecified: Secondary | ICD-10-CM | POA: Insufficient documentation

## 2012-05-31 MED ORDER — IOHEXOL 300 MG/ML  SOLN
100.0000 mL | Freq: Once | INTRAMUSCULAR | Status: AC | PRN
  Administered 2012-05-31: 100 mL via INTRAVENOUS

## 2012-06-08 ENCOUNTER — Other Ambulatory Visit (HOSPITAL_COMMUNITY): Payer: Self-pay | Admitting: Urology

## 2012-06-08 ENCOUNTER — Encounter (HOSPITAL_COMMUNITY): Payer: Self-pay | Admitting: *Deleted

## 2012-06-08 ENCOUNTER — Other Ambulatory Visit: Payer: Self-pay | Admitting: Urology

## 2012-06-08 DIAGNOSIS — C679 Malignant neoplasm of bladder, unspecified: Secondary | ICD-10-CM

## 2012-06-08 NOTE — Progress Notes (Signed)
Need orders in [facility] for surgery on 06/14/12.

## 2012-06-08 NOTE — Progress Notes (Signed)
Abby Watkins,scheduling, notified of patients same day status day of surgery.  Orders for labs, chest x ray placed in epic as anesthesia protocol.  Dr Greer Ee orders are in under other order tab to be signed by him

## 2012-06-08 NOTE — Progress Notes (Signed)
OV notes from 05/24/12, 05/31/12 Dr Juanetta Gosling with clearance note on chart- no lab work results received

## 2012-06-13 NOTE — H&P (Signed)
History of Present Illness  The patient referred for right hydronephrosis, right ureteral mass and bladder mass by Dr. Kari Baars May 2014. He saw Dr. Rito Ehrlich for years and underwent several "TURBT" last one in maybe 2006. Pt did not f/u. He recently developed peripheral edema and underwent Renal ultrasound April 2014 was done for history of bladder cancer and enlarged liver which showed what turned out to be a severely hydronephrotic right kidney with a normal left kidney. CT scan of the abdomen and pelvis was done May 2014 which showed a severely dilated right kidney collecting system and ureter with soft tissue mass effect of the distal ureter and a large mass encompassing the right ureterovesical junction. The lymph nodes and bones appeared normal. 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 He is not on blood thinners.   Currently he is voiding with an adequate flow, he has no urgency or frequency. No hematuria.   Past Medical History Problems  1. History of  Atrial Fibrillation 427.31 2. History of  Bladder Cancer V10.51 3. History of  Chronic Obstructive Pulmonary Disease 496 4. History of  Hypercholesterolemia 272.0 5. History of  Hypertension 401.9 6. History of  Murmurs 785.2  Surgical History Problems  1. History of  Cataract Surgery Bilateral 2. History of  Transurethral Resection Of Prostate (TURP) 3. History of  Transurethral Resection Of Prostate (TURP) 4. History of  Transurethral Resection Of Prostate (TURP)  Current Meds 1. Diltiazem HCl ER 240 MG Oral Capsule Extended Release 24 Hour; Therapy:  (Recorded:07May2014) to 2. Oxygen Use; Therapy: (Recorded:07May2014) to 3. PredniSONE 10 MG Oral Tablet; Therapy: (Recorded:07May2014) to 4. ProAir HFA 108 (90 Base) MCG/ACT  Inhalation Aerosol Solution; Therapy:  (Recorded:07May2014) to 5. Spiriva HandiHaler 18 MCG Inhalation Capsule; Therapy: (Recorded:07May2014) to  Allergies Medication  1. No Known Drug Allergies  Family History Problems  1. Family history of  Death In The Family Father age 41 of old age 41. Family history of  Death In The Family Mother age 65 of pancreatic cancer 3. Family history of  Family Health Status Number Of Children 1 son 4. Maternal history of  Pancreatic Neoplasm  Social History Problems    Alcohol Use   Caffeine Use 6 a day   Marital History - Currently Married   Occupation: Retired Art gallery manager   History of  Tobacco Use 305.1 smokes 1.5 ppd x 60 years  Review of Systems Genitourinary, constitutional, skin, eye, otolaryngeal, hematologic/lymphatic, cardiovascular, pulmonary, endocrine, musculoskeletal, gastrointestinal, neurological and psychiatric system(s) were reviewed and pertinent findings if present are noted.    Vitals  07May2014 03:51PM BMI Calculated: 23.19 BSA Calculated: 2.03 Height: 6 ft 1 in Weight: 175 Blood Pressure: 159 / 102 Temperature: 96.9 F Heart Rate: 116   Physical Exam Constitutional: Well nourished and well developed . No acute distress.  ENT:. The ears and nose are normal in appearance.  Neck: The appearance of the neck is normal and no neck mass is present.  Pulmonary: No respiratory distress and normal respiratory rhythm and effort.  Cardiovascular: Heart rate and rhythm are normal . No peripheral edema.  Abdomen: The abdomen is soft and nontender. No masses are palpated. No CVA tenderness. No hernias are palpable. No hepatosplenomegaly noted.  Lymphatics: The femoral and inguinal nodes are not enlarged  or tender.  Skin: Normal skin turgor, no visible rash and no visible skin lesions.  Neuro/Psych:. Mood and affect are appropriate.    Assessment Assessed  1. Bladder Cancer 188.9 2. Hydronephrosis  591  Plan Hydronephrosis (591)  1. VENIPUNCTURE  Done: 07May2014  Discussion/Summary  Discussed he has a complex problem with significant comorbidities. Will need CMP, CXR, TURBT, Right URS and stent for grading and staging. We discussed the nature risks benefits and alternatives to TURBT. We discussed stent discomfort and patent stents are temporary. We discussed failure to gain retrograde access due to significant tumor burden. Will get pre-op clearance from Dr. Juanetta Gosling. Will get old notes from Dr. Chancy Milroy office.   The patient and his wife are most concerned about the patient's peripheral edema. We discussed this is multifactorial and usually prerenal.    Signatures Electronically signed by : Jerilee Field, M.D.; Jun 11 2012  4:55PM

## 2012-06-13 NOTE — Progress Notes (Signed)
Confirmed with patient that he normally takes one dose of Cardizem daily- states between 9A-10A daily.  Instructed to take with sip water in am before admission to short stay.  Verbalized understanding

## 2012-06-14 ENCOUNTER — Ambulatory Visit (HOSPITAL_COMMUNITY): Payer: Medicare Other

## 2012-06-14 ENCOUNTER — Encounter (HOSPITAL_COMMUNITY): Payer: Self-pay | Admitting: *Deleted

## 2012-06-14 ENCOUNTER — Encounter (HOSPITAL_COMMUNITY)
Admission: RE | Disposition: A | Discharge status: Home / Self Care | Payer: Self-pay | Source: Ambulatory Visit | Attending: Urology

## 2012-06-14 ENCOUNTER — Ambulatory Visit (HOSPITAL_COMMUNITY)
Admission: RE | Admit: 2012-06-14 | Discharge: 2012-06-14 | Disposition: A | Discharge status: Home / Self Care | Payer: Medicare Other | Source: Ambulatory Visit | Attending: Urology | Admitting: Urology

## 2012-06-14 ENCOUNTER — Ambulatory Visit (HOSPITAL_COMMUNITY): Payer: Medicare Other | Admitting: Anesthesiology

## 2012-06-14 ENCOUNTER — Encounter (HOSPITAL_COMMUNITY): Payer: Self-pay | Admitting: Anesthesiology

## 2012-06-14 DIAGNOSIS — N189 Chronic kidney disease, unspecified: Secondary | ICD-10-CM | POA: Insufficient documentation

## 2012-06-14 DIAGNOSIS — J4489 Other specified chronic obstructive pulmonary disease: Secondary | ICD-10-CM | POA: Insufficient documentation

## 2012-06-14 DIAGNOSIS — I129 Hypertensive chronic kidney disease with stage 1 through stage 4 chronic kidney disease, or unspecified chronic kidney disease: Secondary | ICD-10-CM | POA: Insufficient documentation

## 2012-06-14 DIAGNOSIS — C675 Malignant neoplasm of bladder neck: Secondary | ICD-10-CM | POA: Insufficient documentation

## 2012-06-14 DIAGNOSIS — F17200 Nicotine dependence, unspecified, uncomplicated: Secondary | ICD-10-CM | POA: Insufficient documentation

## 2012-06-14 DIAGNOSIS — J449 Chronic obstructive pulmonary disease, unspecified: Secondary | ICD-10-CM | POA: Insufficient documentation

## 2012-06-14 DIAGNOSIS — N1330 Unspecified hydronephrosis: Secondary | ICD-10-CM | POA: Insufficient documentation

## 2012-06-14 DIAGNOSIS — C672 Malignant neoplasm of lateral wall of bladder: Secondary | ICD-10-CM | POA: Insufficient documentation

## 2012-06-14 DIAGNOSIS — I4891 Unspecified atrial fibrillation: Secondary | ICD-10-CM | POA: Insufficient documentation

## 2012-06-14 DIAGNOSIS — R9389 Abnormal findings on diagnostic imaging of other specified body structures: Secondary | ICD-10-CM | POA: Insufficient documentation

## 2012-06-14 DIAGNOSIS — R609 Edema, unspecified: Secondary | ICD-10-CM | POA: Insufficient documentation

## 2012-06-14 DIAGNOSIS — Z9981 Dependence on supplemental oxygen: Secondary | ICD-10-CM | POA: Insufficient documentation

## 2012-06-14 HISTORY — DX: Edema, unspecified: R60.9

## 2012-06-14 HISTORY — PX: TRANSURETHRAL RESECTION OF BLADDER TUMOR: SHX2575

## 2012-06-14 LAB — BASIC METABOLIC PANEL
BUN: 29 mg/dL — ABNORMAL HIGH (ref 6–23)
Calcium: 9.2 mg/dL (ref 8.4–10.5)
Creatinine, Ser: 1.26 mg/dL (ref 0.50–1.35)
GFR calc Af Amer: 64 mL/min — ABNORMAL LOW (ref >90)
GFR calc non Af Amer: 55 mL/min — ABNORMAL LOW (ref >90)
Glucose, Bld: 101 mg/dL — ABNORMAL HIGH (ref 70–99)

## 2012-06-14 LAB — CBC
HCT: 44.9 % (ref 39.0–52.0)
Hemoglobin: 14.3 g/dL (ref 13.0–17.0)
MCH: 29.2 pg (ref 26.0–34.0)
MCHC: 31.8 g/dL (ref 30.0–36.0)
MCV: 91.8 fL (ref 78.0–100.0)
RDW: 13.9 % (ref 11.5–15.5)

## 2012-06-14 LAB — SURGICAL PCR SCREEN: Staphylococcus aureus: NEGATIVE

## 2012-06-14 SURGERY — TURBT (TRANSURETHRAL RESECTION OF BLADDER TUMOR)
Anesthesia: Spinal | Laterality: Right | Wound class: Clean Contaminated

## 2012-06-14 MED ORDER — BUPIVACAINE IN DEXTROSE 0.75-8.25 % IT SOLN
INTRATHECAL | Status: DC | PRN
  Administered 2012-06-14: 1.8 mL via INTRATHECAL

## 2012-06-14 MED ORDER — ACETAMINOPHEN 10 MG/ML IV SOLN
INTRAVENOUS | Status: DC | PRN
  Administered 2012-06-14: 1000 mg via INTRAVENOUS

## 2012-06-14 MED ORDER — ONDANSETRON HCL 4 MG/2ML IJ SOLN
INTRAMUSCULAR | Status: DC | PRN
  Administered 2012-06-14: 4 mg via INTRAVENOUS

## 2012-06-14 MED ORDER — DEXAMETHASONE SODIUM PHOSPHATE 10 MG/ML IJ SOLN
INTRAMUSCULAR | Status: DC | PRN
  Administered 2012-06-14: 10 mg via INTRAVENOUS

## 2012-06-14 MED ORDER — LACTATED RINGERS IV SOLN
INTRAVENOUS | Status: DC | PRN
  Administered 2012-06-14: 07:00:00 via INTRAVENOUS

## 2012-06-14 MED ORDER — CEFAZOLIN SODIUM-DEXTROSE 2-3 GM-% IV SOLR
2.0000 g | INTRAVENOUS | Status: AC
  Administered 2012-06-14: 2 g via INTRAVENOUS

## 2012-06-14 MED ORDER — LABETALOL HCL 5 MG/ML IV SOLN
INTRAVENOUS | Status: AC
  Filled 2012-06-14: qty 4

## 2012-06-14 MED ORDER — PROPOFOL INFUSION 10 MG/ML OPTIME
INTRAVENOUS | Status: DC | PRN
  Administered 2012-06-14: 100 ug/kg/min via INTRAVENOUS

## 2012-06-14 MED ORDER — ACETAMINOPHEN 10 MG/ML IV SOLN
INTRAVENOUS | Status: AC
  Filled 2012-06-14: qty 100

## 2012-06-14 MED ORDER — LIDOCAINE HCL 2 % EX GEL
CUTANEOUS | Status: AC
  Filled 2012-06-14: qty 10

## 2012-06-14 MED ORDER — MUPIROCIN 2 % EX OINT
TOPICAL_OINTMENT | CUTANEOUS | Status: AC
  Filled 2012-06-14: qty 22

## 2012-06-14 MED ORDER — SODIUM CHLORIDE 0.9 % IR SOLN
Status: DC | PRN
  Administered 2012-06-14: 18000 mL via INTRAVESICAL

## 2012-06-14 MED ORDER — CEFAZOLIN SODIUM-DEXTROSE 2-3 GM-% IV SOLR
INTRAVENOUS | Status: AC
  Filled 2012-06-14: qty 50

## 2012-06-14 MED ORDER — KETAMINE HCL 10 MG/ML IJ SOLN
INTRAMUSCULAR | Status: DC | PRN
  Administered 2012-06-14 (×3): 10 mg via INTRAVENOUS

## 2012-06-14 MED ORDER — STERILE WATER FOR IRRIGATION IR SOLN
Status: DC | PRN
  Administered 2012-06-14: 500 mL

## 2012-06-14 MED ORDER — IOHEXOL 300 MG/ML  SOLN
INTRAMUSCULAR | Status: AC
  Filled 2012-06-14: qty 1

## 2012-06-14 MED ORDER — DEXTROSE 5 % IV SOLN
20.0000 mg | INTRAVENOUS | Status: DC | PRN
  Administered 2012-06-14: 20 ug/min via INTRAVENOUS

## 2012-06-14 MED ORDER — FENTANYL CITRATE 0.05 MG/ML IJ SOLN: 25.0000 ug | INTRAMUSCULAR | Status: DC | PRN

## 2012-06-14 MED ORDER — MUPIROCIN 2 % EX OINT
TOPICAL_OINTMENT | Freq: Two times a day (BID) | CUTANEOUS | Status: DC
  Administered 2012-06-14: 1 via NASAL

## 2012-06-14 MED ORDER — LACTATED RINGERS IV SOLN: INTRAVENOUS | Status: DC

## 2012-06-14 MED ORDER — PROMETHAZINE HCL 25 MG/ML IJ SOLN: 6.2500 mg | INTRAMUSCULAR | Status: DC | PRN

## 2012-06-14 MED ORDER — MIDAZOLAM HCL 5 MG/5ML IJ SOLN
INTRAMUSCULAR | Status: DC | PRN
  Administered 2012-06-14 (×2): 1 mg via INTRAVENOUS

## 2012-06-14 MED ORDER — METOCLOPRAMIDE HCL 5 MG/ML IJ SOLN
INTRAMUSCULAR | Status: DC | PRN
  Administered 2012-06-14: 10 mg via INTRAVENOUS

## 2012-06-14 MED ORDER — MEPERIDINE HCL 50 MG/ML IJ SOLN: 6.2500 mg | INTRAMUSCULAR | Status: DC | PRN

## 2012-06-14 SURGICAL SUPPLY — 19 items
BAG URINE DRAINAGE (UROLOGICAL SUPPLIES) ×1 IMPLANT
BAG URO CATCHER STRL LF (DRAPE) ×2 IMPLANT
CATH FOLEY 2WAY SLVR  5CC 18FR (CATHETERS) ×1
CATH FOLEY 2WAY SLVR 5CC 18FR (CATHETERS) IMPLANT
CLOTH BEACON ORANGE TIMEOUT ST (SAFETY) ×2 IMPLANT
DRAPE CAMERA CLOSED 9X96 (DRAPES) ×2 IMPLANT
ELECT BUTTON HF 24-28F 2 30DE (ELECTRODE) ×2 IMPLANT
ELECT LOOP MED HF 24F 12D (CUTTING LOOP) ×1 IMPLANT
ELECT LOOP MED HF 24F 12D CBL (CLIP) ×2 IMPLANT
ELECT REM PT RETURN 9FT ADLT (ELECTROSURGICAL) ×2
ELECT RESECT VAPORIZE 12D CBL (ELECTRODE) IMPLANT
ELECTRODE REM PT RTRN 9FT ADLT (ELECTROSURGICAL) ×1 IMPLANT
GLOVE BIOGEL M STRL SZ7.5 (GLOVE) ×2 IMPLANT
GOWN STRL REIN XL XLG (GOWN DISPOSABLE) ×2 IMPLANT
GUIDEWIRE STR DUAL SENSOR (WIRE) ×1 IMPLANT
KIT ASPIRATION TUBING (SET/KITS/TRAYS/PACK) IMPLANT
MANIFOLD NEPTUNE II (INSTRUMENTS) ×2 IMPLANT
PACK CYSTO (CUSTOM PROCEDURE TRAY) ×2 IMPLANT
TUBING CONNECTING 10 (TUBING) ×2 IMPLANT

## 2012-06-14 NOTE — Transfer of Care (Signed)
Immediate Anesthesia Transfer of Care Note  Patient: Jesse Gallagher  Procedure(s) Performed: Procedure(s): TRANSURETHRAL RESECTION OF BLADDER TUMOR (TURBT) (Right)  Patient Location: PACU  Anesthesia Type:MAC and Spinal  Level of Consciousness: awake, alert , oriented, patient cooperative and responds to stimulation  Airway & Oxygen Therapy: Patient Spontanous Breathing and Patient connected to nasal cannula oxygen  Post-op Assessment: Report given to PACU RN and Post -op Vital signs reviewed and stable  Post vital signs: Reviewed and stable, moving all extremeties  Complications: No apparent anesthesia complications

## 2012-06-14 NOTE — Preoperative (Signed)
Beta Blockers   Reason not to administer Beta Blockers:Not Applicable, not on home BB 

## 2012-06-14 NOTE — Interval H&P Note (Signed)
History and Physical Interval Note:  06/14/2012 7:29 AM  Also discussed side effects of the proposed treatment, the likelihood of the patient achieving the goals of the procedure, and any potential problems that might occur during the procedure or recuperation. All questions answered. Patient elects to proceed as above.   Antony Haste

## 2012-06-14 NOTE — Anesthesia Preprocedure Evaluation (Addendum)
Anesthesia Evaluation  Patient identified by MRN, date of birth, ID band Patient awake    Reviewed: Allergy & Precautions, H&P , NPO status , Patient's Chart, lab work & pertinent test results  Airway Mallampati: II TM Distance: >3 FB Neck ROM: Full    Dental no notable dental hx.    Pulmonary COPD breath sounds clear to auscultation  Pulmonary exam normal       Cardiovascular hypertension, Pt. on medications + dysrhythmias Atrial Fibrillation Rhythm:Regular Rate:Normal     Neuro/Psych negative neurological ROS  negative psych ROS   GI/Hepatic negative GI ROS, Neg liver ROS,   Endo/Other  negative endocrine ROS  Renal/GU negative Renal ROS  negative genitourinary   Musculoskeletal negative musculoskeletal ROS (+)   Abdominal   Peds negative pediatric ROS (+)  Hematology negative hematology ROS (+)   Anesthesia Other Findings   Reproductive/Obstetrics negative OB ROS                          Anesthesia Physical Anesthesia Plan  ASA: II  Anesthesia Plan: Spinal   Post-op Pain Management:    Induction: Intravenous  Airway Management Planned: Simple Face Mask  Additional Equipment:   Intra-op Plan:   Post-operative Plan:   Informed Consent: I have reviewed the patients History and Physical, chart, labs and discussed the procedure including the risks, benefits and alternatives for the proposed anesthesia with the patient or authorized representative who has indicated his/her understanding and acceptance.   Dental advisory given  Plan Discussed with: CRNA  Anesthesia Plan Comments:         Anesthesia Quick Evaluation                                   Anesthesia Evaluation  Patient identified by MRN, date of birth, ID band Patient awake    Reviewed: Allergy & Precautions, H&P , NPO status , Patient's Chart, lab work & pertinent test results  Airway Mallampati:  I      Dental   Pulmonary COPD COPD inhaler,  breath sounds clear to auscultation        Cardiovascular hypertension, PND: new onset. + dysrhythmias Atrial Fibrillation Rhythm:Irregular Rate:Tachycardia     Neuro/Psych    GI/Hepatic   Endo/Other    Renal/GU      Musculoskeletal   Abdominal   Peds  Hematology   Anesthesia Other Findings   Reproductive/Obstetrics                           Anesthesia Physical Anesthesia Plan  ASA: III  Anesthesia Plan: MAC   Post-op Pain Management:    Induction: Intravenous  Airway Management Planned: Nasal Cannula  Additional Equipment:   Intra-op Plan:   Post-operative Plan:   Informed Consent: I have reviewed the patients History and Physical, chart, labs and discussed the procedure including the risks, benefits and alternatives for the proposed anesthesia with the patient or authorized representative who has indicated his/her understanding and acceptance.     Plan Discussed with:   Anesthesia Plan Comments:         Anesthesia Quick Evaluation

## 2012-06-14 NOTE — Interval H&P Note (Signed)
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 After consideration of risks, benefits and other options for treatment, the patient has consented to  Procedure(s): TRANSURETHRAL RESECTION OF BLADDER TUMOR (TURBT) (Right) URETEROSCOPY/STENT PLACEMENT (Right) as a surgical intervention .  The patient's history has been reviewed, patient examined, no change in status, stable for surgery. We discussed rationale for bone scan and elevated alk phos. We discussed need for staged procedures and failure to gain retrograde access given tumor size.  I have reviewed the patient's chart and labs.  Questions were answered to the patient's satisfaction.     Antony Haste

## 2012-06-14 NOTE — Op Note (Addendum)
Preoperative diagnosis: Bladder  mass, right hydronephrosis Postoperative diagnosis: Same   Procedure:  Exam under anesthesia  TURBT greater than 5 cm   Surgeon: Mena Goes  Type of anesthesia: Spinal   Findings:  On exam under anesthesia there is a tense palpable mass encompassing most of the right abdomen consistent with the patient's known hydronephrotic kidney. On bimanual exam the prostate felt normal without enlargement or hard area and there was no palpable bladder mass  But the bladder  so area was somewhat difficult to examine.  Cystoscopy  - the urethra appeared normal. The prostatic urethra appeared normal without enlargement but flat papillary tumor encroached the bladder neck and prostatic urethra from about the 11:00 position all the way around the 4:00 position.  On further inspection of the bladder there was a large bulky papillary tumor in the right posterior bladder encompassing most of the right bladder wall from 11:00 all the way down to 5:00 and spreading posteriorly but also all the way across the trigone and just a few millimeters away from the left ureteral orifice.. On the periphery of this entire lesion the tumor was flat and broad-based.   There seem to be another tumor or another bulky area of the tumor closer to the right bladder neck and this encompassed most of the bladder neck from 11:00 all the way down to 4:00 growing right up to the prostatic urethra.   The patient had pending left ureteral obstruction and likely bladder neck obstruction soon.   Description of procedure: After consent was obtained patient brought to the operating room. After adequate anesthesia he was placed in lithotomy position and prepped and draped in the usual fashion. A timeout was performed to confirm the patient and procedure.  A cystoscope was passed per urethra and the bladder examined in its entirety. The resectoscope sheath was then passed with the visual obturator and then the  resectoscope handle. Using the loop started inferiorly and resected the floor the bladder coming just inferior to the left ureteral orifice to prevent impending left ureteral obstruction. Also also able to, to the 4:00 position of the left bladder neck and come down to clean all this tumor out from the floor the bladder adjacent to the 4 to 6:00 position of the bladder neck. The specimens were sent to pathology.  The right more posterior tumor was not approach. The bulky aspect of tumor near the right bladder neck was begin resected but it was obvious this was a large broad-based tumor that encompassed most of the right bladder wall and right bladder neck and did not appear to be feasible to remove endoscopically. The bladder was not easily distendable which also made visualization resection difficult due to poor fluid flow but this tumor would be very difficult to remove endoscopically, would take multiple procedures and on CT there is evidence of tumor in the right ureteral orifice. Given this he will likely need a right nephroureterectomy and cystectomy. Adequate hemostasis was obtained and the procedure was terminated. An 41 French Foley was placed draining clear urine.  I should add resection was close to but did not include the left ureteral orifice. There was good clear efflux of urine from the left ureteral orifice after all resection and fulguration.   Complications: None  Specimens: #1 left lateral neck tumor (which encompassed tumor on the floor the bladder). #2 bladder neck tumor which was more toward the right side. To pathology  Blood loss: Minimal  Drains: 18 French Foley  Disposition:  Patient stable to PACU

## 2012-06-14 NOTE — Progress Notes (Signed)
Pt was able to stand ata bedside c walker to support/ tol well

## 2012-06-14 NOTE — Anesthesia Postprocedure Evaluation (Signed)
  Anesthesia Post-op Note  Patient: Jesse Gallagher  Procedure(s) Performed: Procedure(s) (LRB): TRANSURETHRAL RESECTION OF BLADDER TUMOR (TURBT) (Right)  Patient Location: PACU  Anesthesia Type: Spinal  Level of Consciousness: awake and alert   Airway and Oxygen Therapy: Patient Spontanous Breathing  Post-op Pain: mild  Post-op Assessment: Post-op Vital signs reviewed, Patient's Cardiovascular Status Stable, Respiratory Function Stable, Patent Airway and No signs of Nausea or vomiting  Last Vitals:  Filed Vitals:   06/14/12 1029  BP: 147/93  Pulse: 72  Temp: 36.3 C  Resp: 16    Post-op Vital Signs: stable   Complications: No apparent anesthesia complications

## 2012-06-14 NOTE — Anesthesia Procedure Notes (Signed)
Spinal  Patient location during procedure: OR Staffing Anesthesiologist: Kahlia Lagunes Performed by: anesthesiologist  Preanesthetic Checklist Completed: patient identified, site marked, surgical consent, pre-op evaluation, timeout performed, IV checked, risks and benefits discussed and monitors and equipment checked Spinal Block Patient position: sitting Prep: Betadine Patient monitoring: heart rate, continuous pulse ox and blood pressure Approach: right paramedian Location: L3-4 Injection technique: single-shot Needle Needle type: Spinocan  Needle gauge: 22 G Needle length: 9 cm Additional Notes Expiration date of kit checked and confirmed. Patient tolerated procedure well, without complications.     

## 2012-06-15 ENCOUNTER — Encounter (HOSPITAL_COMMUNITY): Payer: Self-pay | Admitting: Urology

## 2012-06-19 ENCOUNTER — Encounter (HOSPITAL_COMMUNITY): Payer: Medicare Other

## 2012-06-27 DIAGNOSIS — J189 Pneumonia, unspecified organism: Secondary | ICD-10-CM | POA: Insufficient documentation

## 2012-07-04 DIAGNOSIS — C679 Malignant neoplasm of bladder, unspecified: Secondary | ICD-10-CM | POA: Insufficient documentation

## 2012-07-09 DIAGNOSIS — I10 Essential (primary) hypertension: Secondary | ICD-10-CM | POA: Insufficient documentation

## 2012-07-09 DIAGNOSIS — J449 Chronic obstructive pulmonary disease, unspecified: Secondary | ICD-10-CM | POA: Insufficient documentation

## 2012-07-24 DIAGNOSIS — R7881 Bacteremia: Secondary | ICD-10-CM | POA: Insufficient documentation

## 2012-07-24 DIAGNOSIS — I5042 Chronic combined systolic (congestive) and diastolic (congestive) heart failure: Secondary | ICD-10-CM | POA: Insufficient documentation

## 2012-08-06 DIAGNOSIS — N1330 Unspecified hydronephrosis: Secondary | ICD-10-CM | POA: Insufficient documentation

## 2012-08-14 DIAGNOSIS — C672 Malignant neoplasm of lateral wall of bladder: Secondary | ICD-10-CM | POA: Insufficient documentation

## 2012-09-26 ENCOUNTER — Encounter: Payer: Self-pay | Admitting: *Deleted

## 2012-09-26 DIAGNOSIS — R0602 Shortness of breath: Secondary | ICD-10-CM | POA: Insufficient documentation

## 2012-09-26 DIAGNOSIS — E782 Mixed hyperlipidemia: Secondary | ICD-10-CM | POA: Insufficient documentation

## 2012-09-26 DIAGNOSIS — I739 Peripheral vascular disease, unspecified: Secondary | ICD-10-CM

## 2012-09-26 DIAGNOSIS — I4891 Unspecified atrial fibrillation: Secondary | ICD-10-CM | POA: Insufficient documentation

## 2012-09-26 DIAGNOSIS — F17200 Nicotine dependence, unspecified, uncomplicated: Secondary | ICD-10-CM | POA: Insufficient documentation

## 2012-09-26 DIAGNOSIS — I10 Essential (primary) hypertension: Secondary | ICD-10-CM | POA: Insufficient documentation

## 2012-09-26 DIAGNOSIS — I714 Abdominal aortic aneurysm, without rupture: Secondary | ICD-10-CM | POA: Insufficient documentation

## 2012-09-26 DIAGNOSIS — R609 Edema, unspecified: Secondary | ICD-10-CM | POA: Insufficient documentation

## 2012-09-26 DIAGNOSIS — J449 Chronic obstructive pulmonary disease, unspecified: Secondary | ICD-10-CM

## 2012-09-27 ENCOUNTER — Ambulatory Visit: Payer: Medicare Other | Admitting: Cardiology

## 2012-09-27 ENCOUNTER — Encounter: Payer: Self-pay | Admitting: Cardiology

## 2012-09-27 ENCOUNTER — Ambulatory Visit (INDEPENDENT_AMBULATORY_CARE_PROVIDER_SITE_OTHER): Payer: Medicare Other | Admitting: Cardiology

## 2012-09-27 ENCOUNTER — Encounter: Payer: Self-pay | Admitting: *Deleted

## 2012-09-27 VITALS — BP 122/85 | HR 66 | Ht 73.0 in | Wt 172.2 lb

## 2012-09-27 DIAGNOSIS — I4891 Unspecified atrial fibrillation: Secondary | ICD-10-CM

## 2012-09-27 DIAGNOSIS — I2510 Atherosclerotic heart disease of native coronary artery without angina pectoris: Secondary | ICD-10-CM | POA: Insufficient documentation

## 2012-09-27 DIAGNOSIS — Z01810 Encounter for preprocedural cardiovascular examination: Secondary | ICD-10-CM

## 2012-09-27 DIAGNOSIS — I25810 Atherosclerosis of coronary artery bypass graft(s) without angina pectoris: Secondary | ICD-10-CM

## 2012-09-27 DIAGNOSIS — J449 Chronic obstructive pulmonary disease, unspecified: Secondary | ICD-10-CM

## 2012-09-27 DIAGNOSIS — Z01818 Encounter for other preprocedural examination: Secondary | ICD-10-CM

## 2012-09-27 DIAGNOSIS — R609 Edema, unspecified: Secondary | ICD-10-CM

## 2012-09-27 DIAGNOSIS — I714 Abdominal aortic aneurysm, without rupture: Secondary | ICD-10-CM

## 2012-09-27 NOTE — Progress Notes (Signed)
Clinical Summary Jesse Gallagher is a 72 y.o.male referred for preoperative consultation. Primary care physician is Dr. Juanetta Gosling, urologist is Dr. Mena Goes. The patient is being considered for elective right nephrectomy by Dr. Earlene Plater at [facility]. He has already undergone TURBT and right URB/stent with documented right hydronephrosis associated with a right ureteral mass and bladder mass - cancer.  Recent imaging indicates presence of atherosclerosis involving the aorta and coronaries as well as infrarenal AAA. No definitive history of obstructive CAD or myocardial infarction. Echocardiogram from January of this year demonstrated LVEF 50-55%, moderately calcified aortic leaflets with trivial aortic regurgitation, aortic calcification, mild left atrial enlargement, mildly dilated right ventricle with moderately reduced function, PASP 32 mm mercury.  He has severe COPD, oxygen dependent. He is followed by Dr. Juanetta Gosling for this. He reports limited functional capacity, chronic shortness of breath, NYHA class III. No obvious angina symptoms. No definitive palpitations with permanent atrial fibrillation. He has not been anticoagulated.  Reports undergoing a stress test approximately 10 years ago.  No Known Allergies  Current Outpatient Prescriptions  Medication Sig Dispense Refill  . albuterol (PROAIR HFA) 108 (90 BASE) MCG/ACT inhaler 2 puffs. Inhale 2 puffs into the lungs every 6 (six) hours as needed for Wheezing.      . budesonide-formoterol (SYMBICORT) 160-4.5 MCG/ACT inhaler 2 puffs. Inhale 2 puffs into the lungs every 12 hours.      Marland Kitchen diltiazem (CARDIZEM CD) 240 MG 24 hr capsule 240 mg. Take 240 mg by mouth daily.      . furosemide (LASIX) 80 MG tablet Take 40 mg by mouth daily as needed. Take 1 tablet (80 mg total) by mouth daily.      . predniSONE (DELTASONE) 10 MG tablet Take 10 mg by mouth daily.      Marland Kitchen tiotropium (SPIRIVA) 18 MCG inhalation capsule Place 18 mcg into inhaler and inhale daily.        No current facility-administered medications for this visit.    Past Medical History  Diagnosis Date  . Essential hypertension, benign   . Mixed hyperlipidemia   . Bladder cancer   . Atrial fibrillation   . COPD (chronic obstructive pulmonary disease)     Uses oxygen at 2L/[place]   . Edema   . Peripheral vascular disease   . Abdominal aortic aneurysm     Infrarenal 4.0 x 3.6 cm - 05/2012    Past Surgical History  Procedure Laterality Date  . Transurethral resection of prostate    . Tonsillectomy    . Cataract extraction w/phaco  02/14/2012    Procedure: CATARACT EXTRACTION PHACO AND INTRAOCULAR LENS PLACEMENT (IOC);  Surgeon: Loraine Leriche T. Nile Riggs, MD;  Location: [facility];  Service: Ophthalmology;  Laterality: Right;  CDE:13.14  . Cataract extraction w/phaco  02/28/2012    Procedure: CATARACT EXTRACTION PHACO AND INTRAOCULAR LENS PLACEMENT (IOC);  Surgeon: Loraine Leriche T. Nile Riggs, MD;  Location: [facility];  Service: Ophthalmology;  Laterality: Left;  CDE: 14.65  . Transurethral resection of bladder tumor Right 06/14/2012    Procedure: TRANSURETHRAL RESECTION OF BLADDER TUMOR (TURBT);  Surgeon: Antony Haste, MD;  Location: [facility];  Service: Urology;  Laterality: Right;    Family History  Problem Relation Age of Onset  . Pancreatic cancer Mother     Social History Jesse Gallagher reports that he has quit smoking. His smoking use included Cigarettes. He has a 60 pack-year smoking history. He has never used smokeless tobacco. Jesse Gallagher reports that  drinks alcohol.  Review of Systems  Intermittent hematuria. No fevers or chills. No abdominal pain. Chronic leg edema. Has history of anasarca. On chronic diuretic. Otherwise negative.  Physical Examination Filed Vitals:   09/27/12 1255  BP: 122/85  Pulse: 66   Filed Weights   09/27/12 1255  Weight: 172 lb 4 oz (78.132 kg)   A chronically ill-appearing male seated in a wheelchair wearing oxygen. HEENT: Conjunctiva and lids normal,  oropharynx clear. Neck: Supple, no elevated JVP or carotid bruits, no thyromegaly. Lungs: Diminished breath sounds throughout, nonlabored breathing at rest. Cardiac: Irregularly irregular, no S3, 2/6 systolic murmur, no pericardial rub. Abdomen: Soft, nontender, bowel sounds present, no guarding or rebound. Extremities: Chronic appearing edema, 3+ below the knees diminished distal pulses.. Skin: Warm and dry. Distal stasis. Musculoskeletal: No kyphosis. Neuropsychiatric: Alert and oriented x3, affect grossly appropriate.   Problem List and Plan   Preoperative cardiovascular examination Records reviewed. Patient with bladder cancer being considered for right nephrectomy by Dr. Earlene Plater at [facility]. Has already tolerated TURBT under spinal anesthesia in May. He has severe COPD, oxygen requiring, which certainly increases his perioperative risk. This is being addressed by Dr. Juanetta Gosling. He also has clear evidence of underlying atherosclerotic disease involving the coronaries and aorta. LVEF was low normal as of January. He has not had any ischemic workup in several years. ECG today shows atrial fibrillation with nonspecific ST changes and decreased R wave progression. We will plan a Lexiscan Myoview for ischemic evaluation. Unless this study is high risk, would anticipate that he should be able to proceed with surgery at an overall moderate perioperative risk. Will review and inform the patient.  Coronary atherosclerosis of native coronary artery As evidenced by finding of coronary calcifications on CT. LVEF 50-55% as of January.  AAA (abdominal aortic aneurysm) Relatively small, 4.0 x 3.6 cm.  COPD (chronic obstructive pulmonary disease) Severe, oxygen requiring, followed by Dr. Juanetta Gosling.  Edema Chronic. I expect that this is contributed to significantly by the patient's COPD with resulting cor pulmonale, moderate RV dysfunction by echocardiogram. He will need to stay on diuretic treatment.  Atrial  fibrillation Presumably long-standing, followed by Dr. Juanetta Gosling on calcium channel blocker. He has not been anticoagulated. This may need to be addressed going forward, however would not initiate treatment with pending surgery.    Jonelle Sidle, M.D., F.A.C.C.

## 2012-09-27 NOTE — Assessment & Plan Note (Signed)
As evidenced by finding of coronary calcifications on CT. LVEF 50-55% as of January.

## 2012-09-27 NOTE — Assessment & Plan Note (Signed)
Relatively small, 4.0 x 3.6 cm.

## 2012-09-27 NOTE — Assessment & Plan Note (Signed)
Presumably long-standing, followed by Dr. Juanetta Gosling on calcium channel blocker. He has not been anticoagulated. This may need to be addressed going forward, however would not initiate treatment with pending surgery.

## 2012-09-27 NOTE — Assessment & Plan Note (Signed)
Chronic. I expect that this is contributed to significantly by the patient's COPD with resulting cor pulmonale, moderate RV dysfunction by echocardiogram. He will need to stay on diuretic treatment.

## 2012-09-27 NOTE — Assessment & Plan Note (Signed)
Severe, oxygen requiring, followed by Dr. Juanetta Gosling.

## 2012-09-27 NOTE — Patient Instructions (Addendum)
Your physician recommends that you schedule a follow-up appointment in: AS NEEDED  Your physician has requested that you have a lexiscan myoview. For further information please visit [email or web address] Please follow instruction sheet, as given.WE WILL CALL YOU WITH THE RESULTS

## 2012-09-27 NOTE — Assessment & Plan Note (Signed)
Records reviewed. Patient with bladder cancer being considered for right nephrectomy by Dr. Earlene Plater at [facility]. Has already tolerated TURBT under spinal anesthesia in May. He has severe COPD, oxygen requiring, which certainly increases his perioperative risk. This is being addressed by Dr. Juanetta Gosling. He also has clear evidence of underlying atherosclerotic disease involving the coronaries and aorta. LVEF was low normal as of January. He has not had any ischemic workup in several years. ECG today shows atrial fibrillation with nonspecific ST changes and decreased R wave progression. We will plan a Lexiscan Myoview for ischemic evaluation. Unless this study is high risk, would anticipate that he should be able to proceed with surgery at an overall moderate perioperative risk. Will review and inform the patient.

## 2012-09-28 ENCOUNTER — Encounter: Payer: Self-pay | Admitting: Cardiology

## 2012-10-02 ENCOUNTER — Encounter (HOSPITAL_COMMUNITY)
Admission: RE | Admit: 2012-10-02 | Discharge: 2012-10-02 | Disposition: A | Discharge status: Home / Self Care | Payer: Medicare Other | Source: Ambulatory Visit | Attending: Cardiology | Admitting: Cardiology

## 2012-10-02 ENCOUNTER — Encounter (HOSPITAL_COMMUNITY): Payer: Self-pay

## 2012-10-02 DIAGNOSIS — I4891 Unspecified atrial fibrillation: Secondary | ICD-10-CM

## 2012-10-02 DIAGNOSIS — I10 Essential (primary) hypertension: Secondary | ICD-10-CM | POA: Insufficient documentation

## 2012-10-02 DIAGNOSIS — J449 Chronic obstructive pulmonary disease, unspecified: Secondary | ICD-10-CM | POA: Insufficient documentation

## 2012-10-02 DIAGNOSIS — E785 Hyperlipidemia, unspecified: Secondary | ICD-10-CM | POA: Insufficient documentation

## 2012-10-02 DIAGNOSIS — I25810 Atherosclerosis of coronary artery bypass graft(s) without angina pectoris: Secondary | ICD-10-CM

## 2012-10-02 DIAGNOSIS — I2510 Atherosclerotic heart disease of native coronary artery without angina pectoris: Secondary | ICD-10-CM

## 2012-10-02 DIAGNOSIS — Z01818 Encounter for other preprocedural examination: Secondary | ICD-10-CM

## 2012-10-02 DIAGNOSIS — C679 Malignant neoplasm of bladder, unspecified: Secondary | ICD-10-CM | POA: Insufficient documentation

## 2012-10-02 DIAGNOSIS — J4489 Other specified chronic obstructive pulmonary disease: Secondary | ICD-10-CM | POA: Insufficient documentation

## 2012-10-02 DIAGNOSIS — Z951 Presence of aortocoronary bypass graft: Secondary | ICD-10-CM | POA: Insufficient documentation

## 2012-10-02 MED ORDER — TECHNETIUM TC 99M SESTAMIBI - CARDIOLITE
10.0000 | Freq: Once | INTRAVENOUS | Status: AC | PRN
  Administered 2012-10-02: 10:00:00 10.5 via INTRAVENOUS

## 2012-10-02 MED ORDER — SODIUM CHLORIDE 0.9 % IJ SOLN
INTRAMUSCULAR | Status: AC
  Administered 2012-10-02: 10 mL via INTRAVENOUS
  Filled 2012-10-02: qty 10

## 2012-10-02 MED ORDER — TECHNETIUM TC 99M SESTAMIBI - CARDIOLITE
30.0000 | Freq: Once | INTRAVENOUS | Status: AC | PRN
  Administered 2012-10-02: 30 via INTRAVENOUS

## 2012-10-02 MED ORDER — REGADENOSON 0.4 MG/5ML IV SOLN
INTRAVENOUS | Status: AC
  Administered 2012-10-02: 0.4 mg via INTRAVENOUS
  Filled 2012-10-02: qty 5

## 2012-10-02 NOTE — Progress Notes (Signed)
Stress Lab Nurses Notes - [name]  Jesse Gallagher 10/02/2012 Reason for doing test: Surgical Clearance and AFib Type of test: [name] Nurse performing test: Parke Poisson, RN Nuclear Medicine Tech: Lou Cal Echo Tech: Not Applicable MD performing test: Ival Bible / Joni Reining NP Family MD: Dr. Juanetta Gosling Test explained and consent signed: yes IV started: 22g jelco, Saline lock flushed, No redness or edema and Saline lock started in radiology Symptoms: Slight Chest tightness Treatment/Intervention: None Reason test stopped: protocol completed After recovery IV was: Discontinued via X-ray tech and No redness or edema Patient to return to Nuc. Med at : 12:30 Patient discharged: Home Patient's Condition upon discharge was: stable Comments: During test BP 145/96  & HR 128.  Recovery BP 156/99 & HR 108.  Symptoms resolved in recovery. Erskine Speed T

## 2012-10-03 ENCOUNTER — Other Ambulatory Visit: Payer: Self-pay | Admitting: *Deleted

## 2012-10-03 DIAGNOSIS — Z01810 Encounter for preprocedural cardiovascular examination: Secondary | ICD-10-CM

## 2012-10-03 DIAGNOSIS — I4891 Unspecified atrial fibrillation: Secondary | ICD-10-CM

## 2012-10-03 DIAGNOSIS — I25810 Atherosclerosis of coronary artery bypass graft(s) without angina pectoris: Secondary | ICD-10-CM

## 2012-10-03 DIAGNOSIS — I2510 Atherosclerotic heart disease of native coronary artery without angina pectoris: Secondary | ICD-10-CM

## 2012-10-04 ENCOUNTER — Ambulatory Visit (HOSPITAL_COMMUNITY)
Admission: RE | Admit: 2012-10-04 | Discharge: 2012-10-04 | Disposition: A | Discharge status: Home / Self Care | Payer: Medicare Other | Source: Ambulatory Visit | Attending: Cardiology | Admitting: Cardiology

## 2012-10-04 DIAGNOSIS — I359 Nonrheumatic aortic valve disorder, unspecified: Secondary | ICD-10-CM

## 2012-10-04 DIAGNOSIS — J4489 Other specified chronic obstructive pulmonary disease: Secondary | ICD-10-CM | POA: Insufficient documentation

## 2012-10-04 DIAGNOSIS — R0609 Other forms of dyspnea: Secondary | ICD-10-CM | POA: Insufficient documentation

## 2012-10-04 DIAGNOSIS — I4891 Unspecified atrial fibrillation: Secondary | ICD-10-CM | POA: Insufficient documentation

## 2012-10-04 DIAGNOSIS — I10 Essential (primary) hypertension: Secondary | ICD-10-CM | POA: Insufficient documentation

## 2012-10-04 DIAGNOSIS — I2510 Atherosclerotic heart disease of native coronary artery without angina pectoris: Secondary | ICD-10-CM | POA: Insufficient documentation

## 2012-10-04 DIAGNOSIS — J449 Chronic obstructive pulmonary disease, unspecified: Secondary | ICD-10-CM | POA: Insufficient documentation

## 2012-10-04 DIAGNOSIS — R0989 Other specified symptoms and signs involving the circulatory and respiratory systems: Secondary | ICD-10-CM | POA: Insufficient documentation

## 2012-10-04 DIAGNOSIS — Z01810 Encounter for preprocedural cardiovascular examination: Secondary | ICD-10-CM

## 2012-10-04 DIAGNOSIS — I25810 Atherosclerosis of coronary artery bypass graft(s) without angina pectoris: Secondary | ICD-10-CM

## 2012-10-04 NOTE — Progress Notes (Signed)
*  PRELIMINARY RESULTS* Echocardiogram 2D Echocardiogram has been performed.  Jesse Gallagher 10/04/2012, 9:24 AM

## 2012-12-01 DEATH — deceased

## 2012-12-06 ENCOUNTER — Other Ambulatory Visit: Payer: Self-pay

## 2014-07-28 ENCOUNTER — Other Ambulatory Visit: Payer: Self-pay

## 2014-08-28 IMAGING — US US ABDOMEN COMPLETE
1 series · 13 of 25 positions shown · non-contrast
Comparison: None.

CLINICAL DATA: Enlarged liver.  History of bladder cancer.

COMPLETE ABDOMINAL ULTRASOUND

[Series 1: us abdomen complete · 0.25mm/px · 13 of 109 slices shown]
[im 1/109]
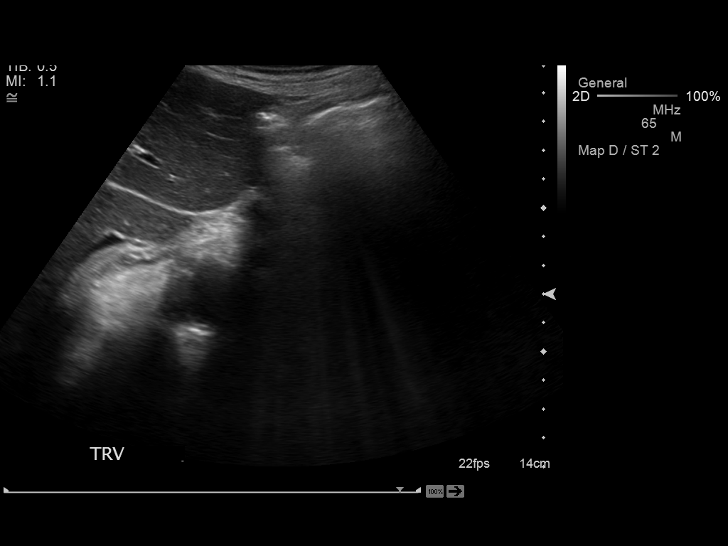
[im 10/109]
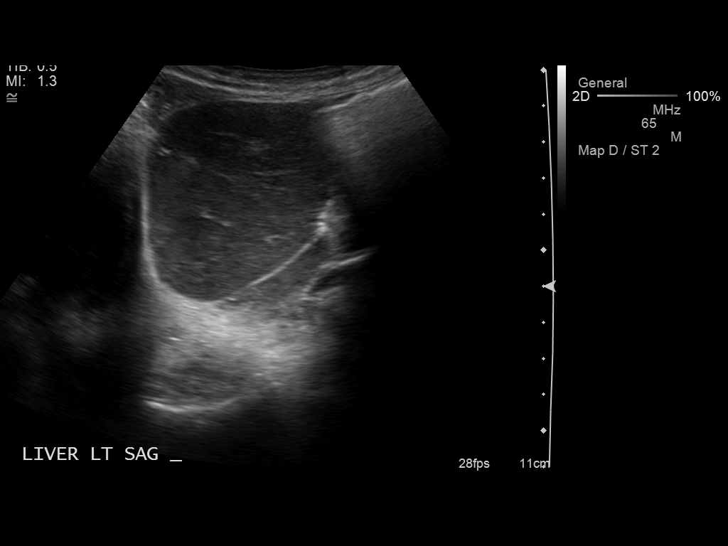
[im 19/109]
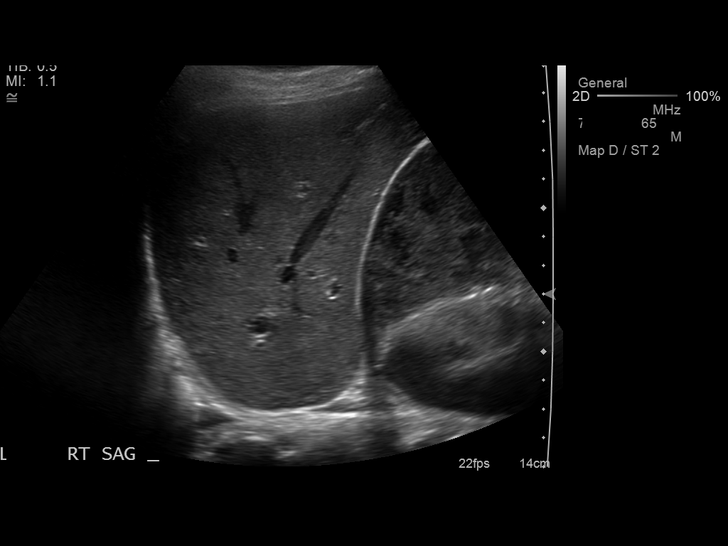
[im 28/109]
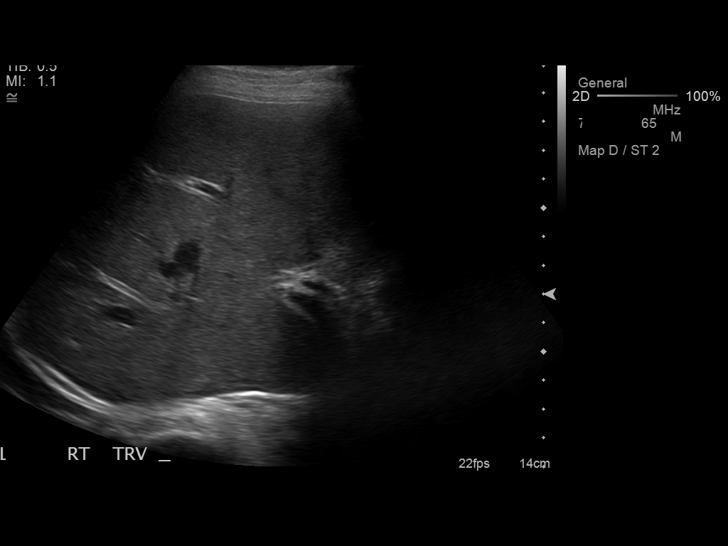
[im 37/109]
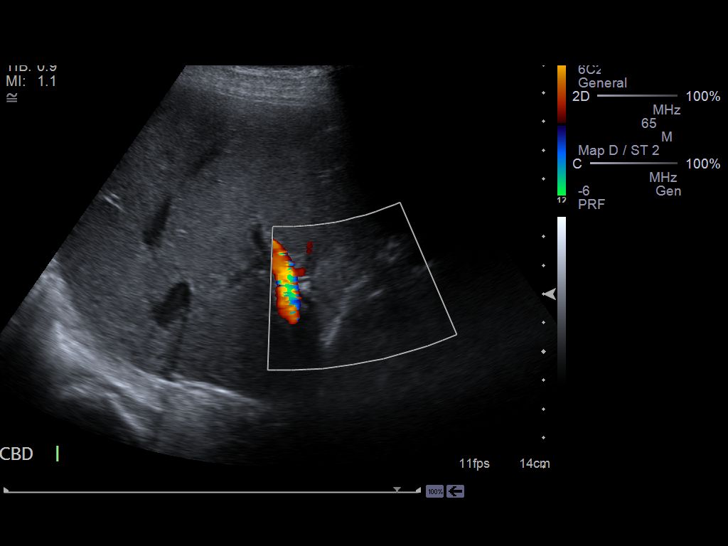
[im 46/109]
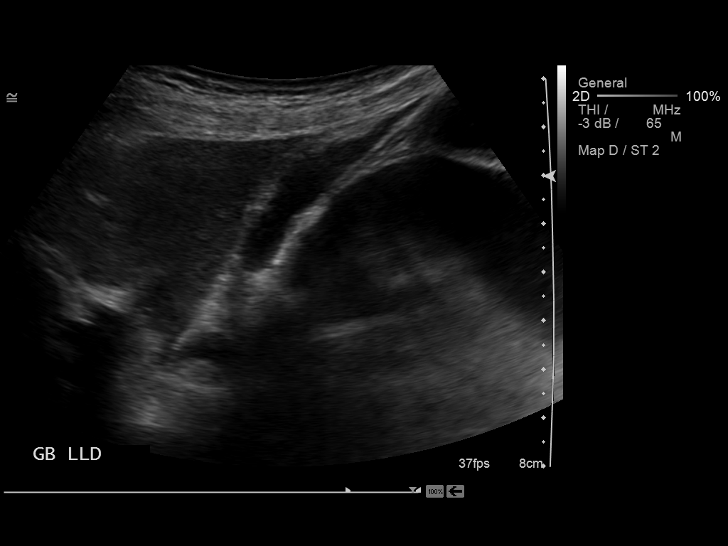
[im 55/109]
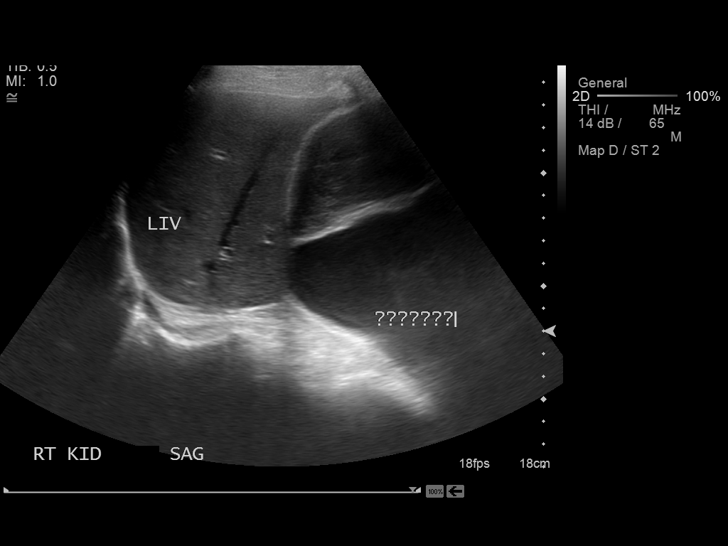
[im 64/109]
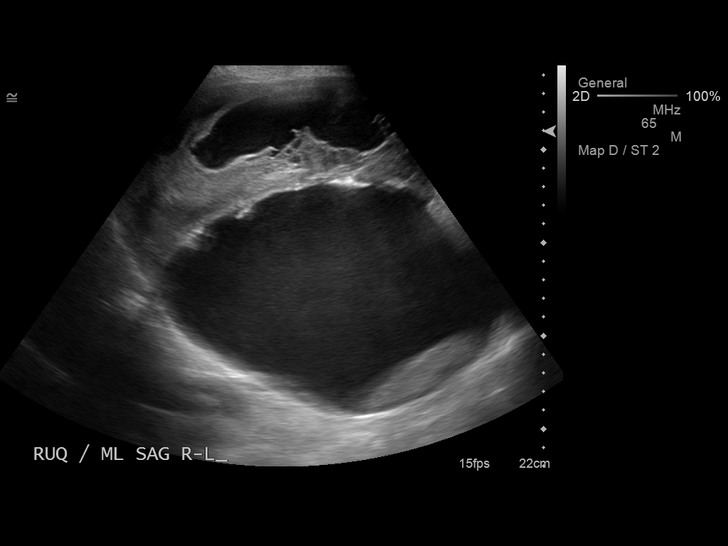
[im 73/109]
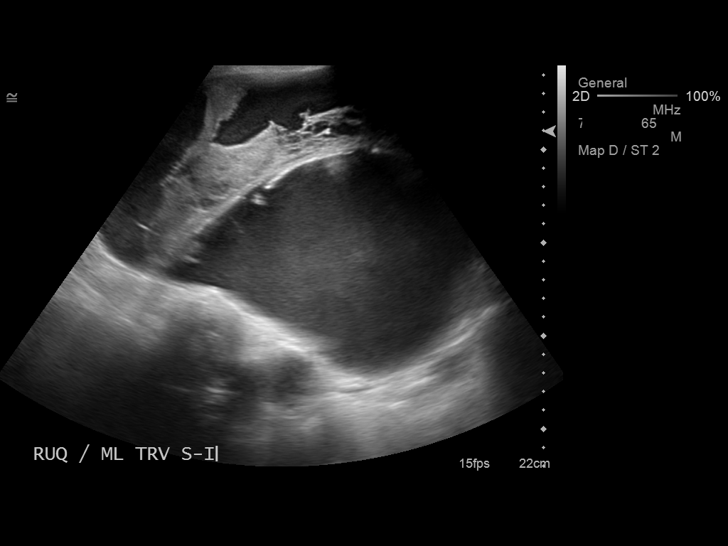
[im 82/109]
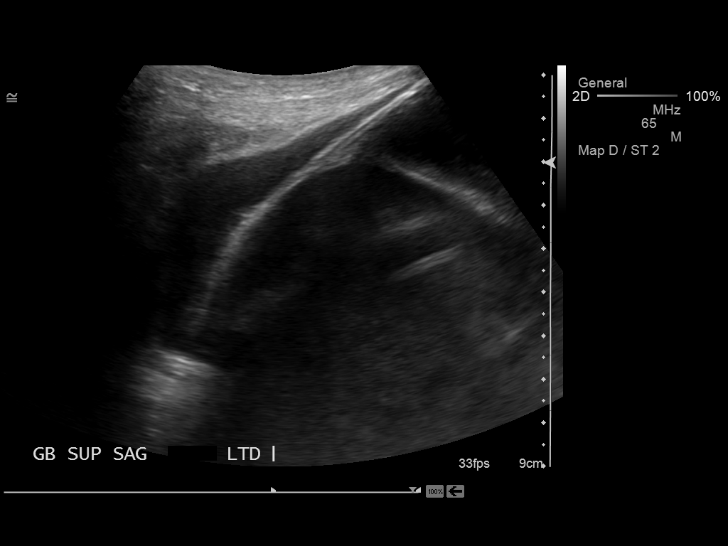
[im 91/109]
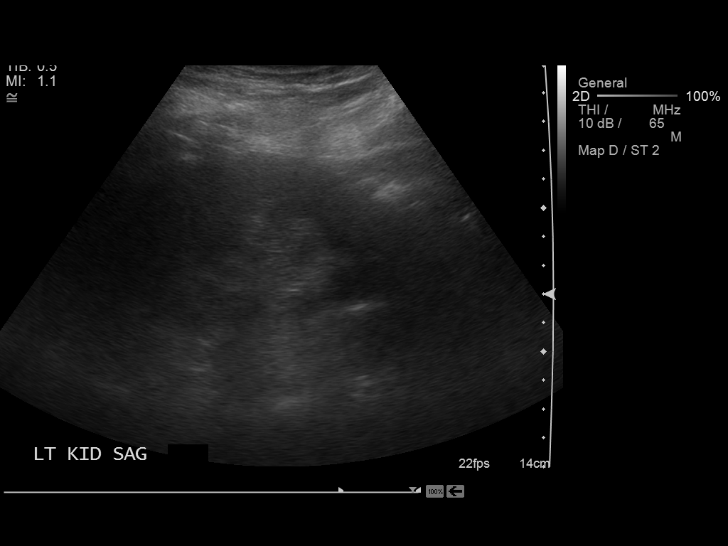
[im 100/109]
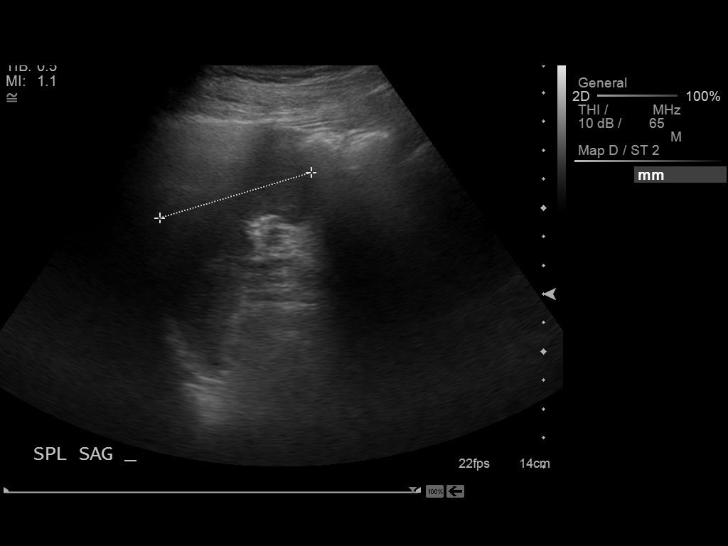
[im 109/109]
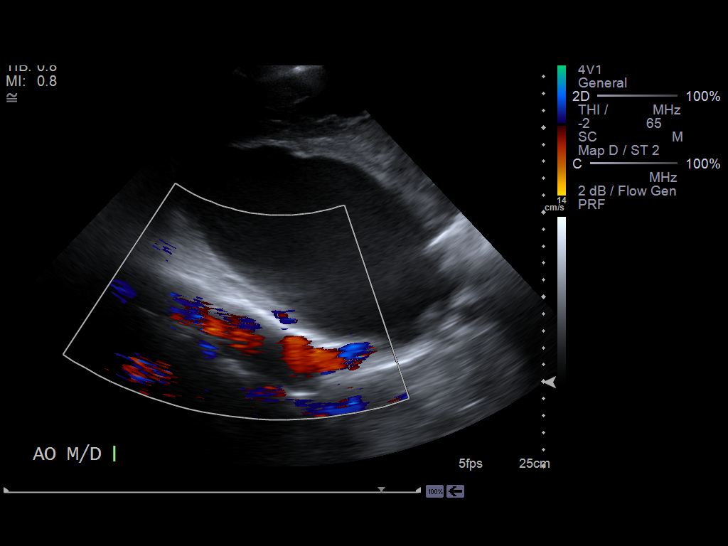

[13 of 25 positions shown; findings below may reference images not displayed]

FINDINGS: Gallbladder:  Not well visualized. No definite stones or wall
thickening.

Common bile duct:  3 mm

Liver:  The liver has a normal appearance but there is a large mass
adjacent to the inferior right hepatic lobe. This mass roughly
measures 22.9 x 18.0 x 21.1 cm.  There are large fluid or cystic
components in this lesion with soft tissue or septations.  Overall,
there is no significant vascular flow within this large lesion.
There is evidence for a small right pleural effusion.

IVC:  Appears normal.

Pancreas:  Not visualized.

Spleen:  Spleen is difficult to visualize and roughly measures
cm in length.

Right Kidney: Not visualized.

Left Kidney:  Left kidney measures 10.1 cm in length without
hydronephrosis.

Abdominal aorta:  The abdominal aorta is enlarged measuring 3.7 cm
in diameter.
IMPRESSION: There is a large complex lesion occupying the right side of the
abdomen.  This structure appears to represent a complex fluid or
cystic collection.  The lesion could be involving the right kidney
since the right kidney is not visualized.  Recommend further
evaluation of this structure with a CT of the abdomen and pelvis.

Abdominal aortic aneurysm, measuring up to 3.7 cm.

Right pleural effusion.

to Dr. Firchichi, who verbally acknowledged these results.

## 2014-08-31 IMAGING — CT CT ABD-PELV W/ CM
2 of 5 series · 15 of 46 positions shown, 17 images · IV contrast (omnipaque)
Comparison: None
Correlation:  Ultrasound abdomen 05/28/2012

CLINICAL DATA: Cystic mass in right abdomen on ultrasound; past
history of bladder cancer in 8555, hypertension, COPD.

CT ABDOMEN AND PELVIS WITH CONTRAST
TECHNIQUE: Multidetector CT imaging of the abdomen and pelvis was
performed following the standard protocol during bolus
administration of intravenous contrast. Sagittal and coronal MPR
images reconstructed from axial data set.
Contrast: 100mL OMNIPAQUE IOHEXOL 300 MG/ML  SOLN Dilute oral
contrast.

[Series 2: abd_pel_with 5.0 b40f · axial · 0.69mm/px · z∈[-479,-104]mm · 12 of 87 slices shown, 14 images]
[im 6/87  soft-tissue]
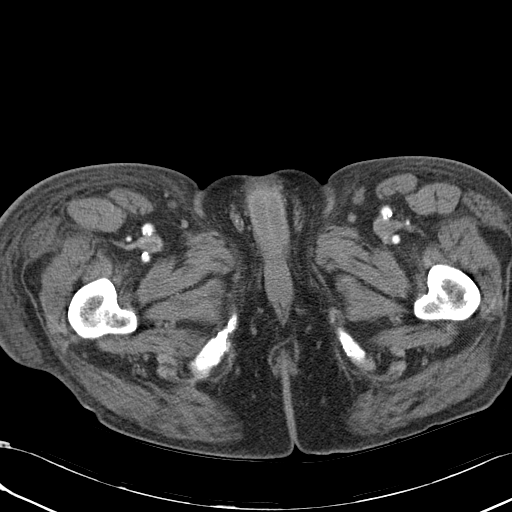
[im 6/87  bone]
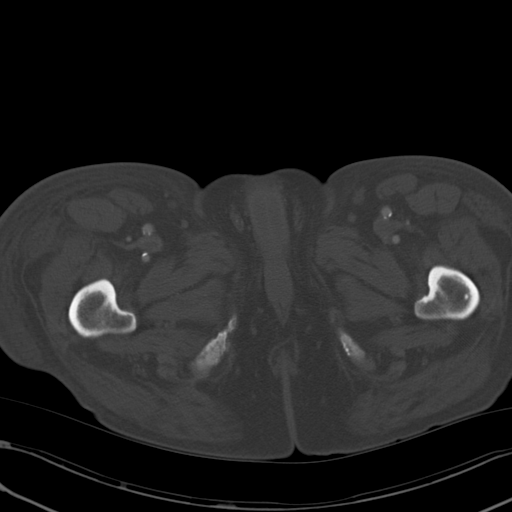
[im 16/87  soft-tissue]
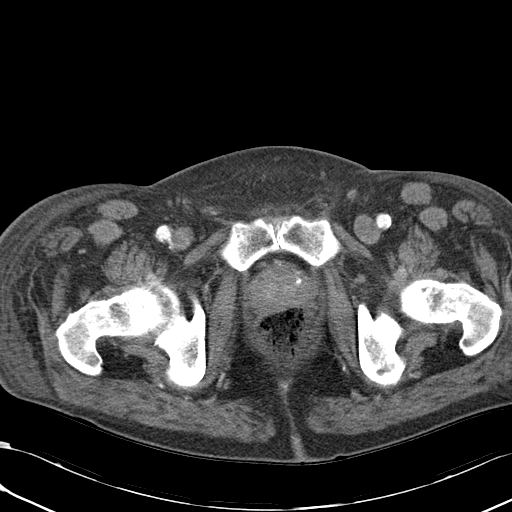
[im 21/87  soft-tissue]
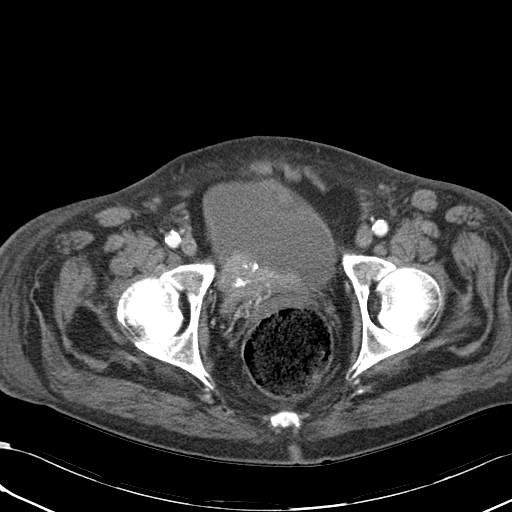
[im 26/87  soft-tissue]
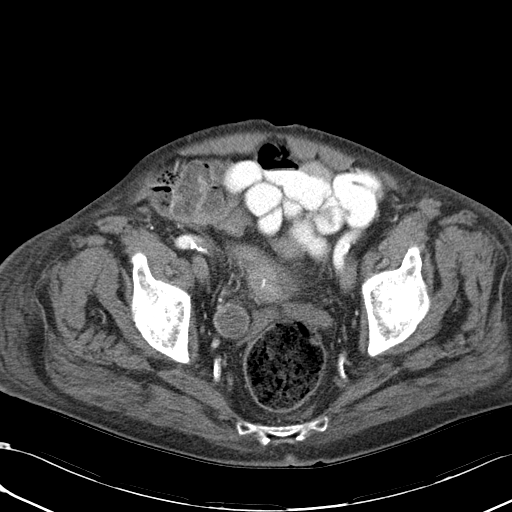
[im 36/87  soft-tissue]
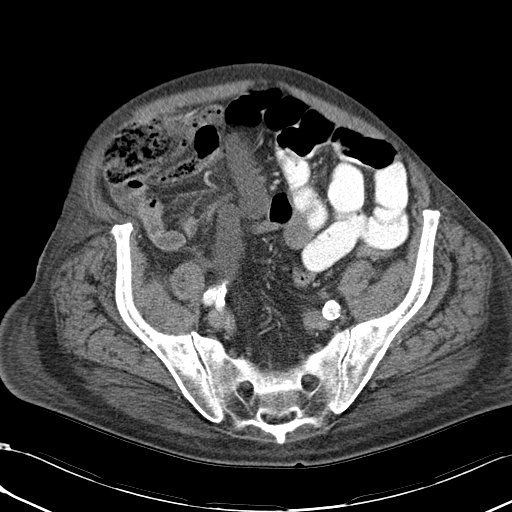
[im 41/87  soft-tissue]
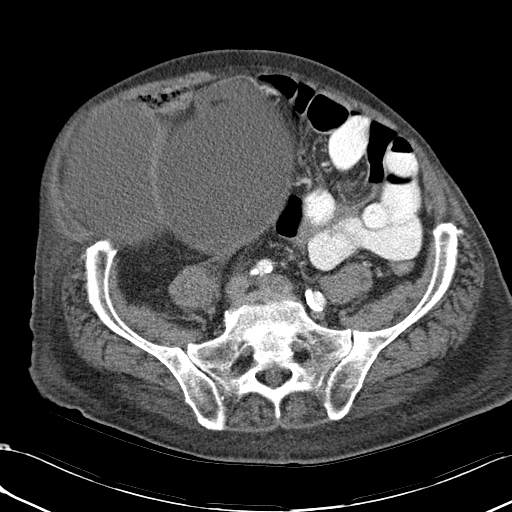
[im 46/87  soft-tissue]
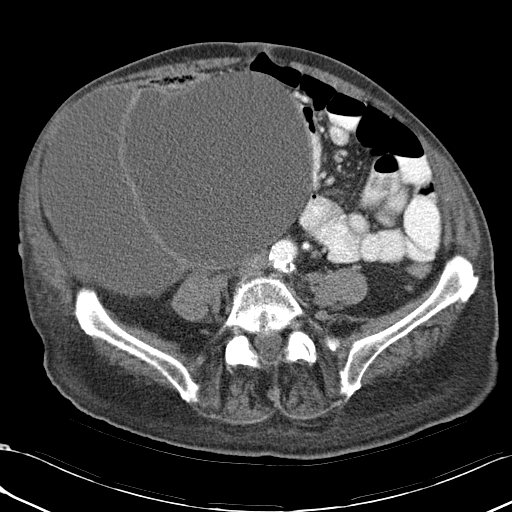
[im 56/87  soft-tissue]
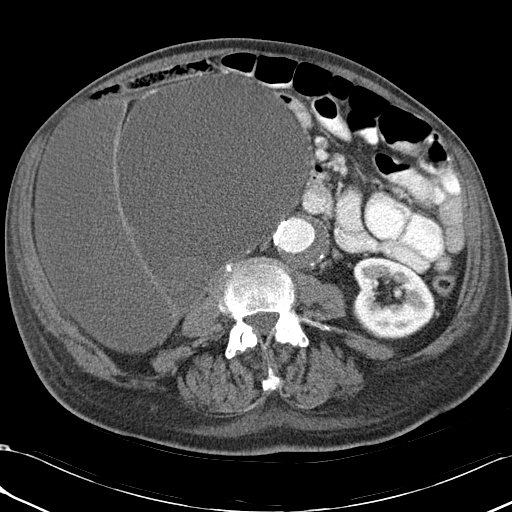
[im 61/87  soft-tissue]
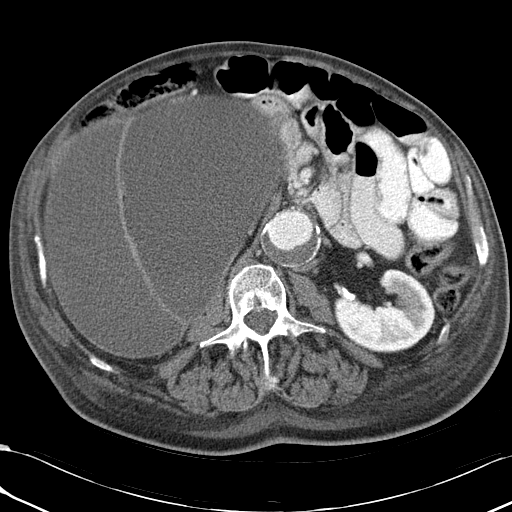
[im 61/87  bone]
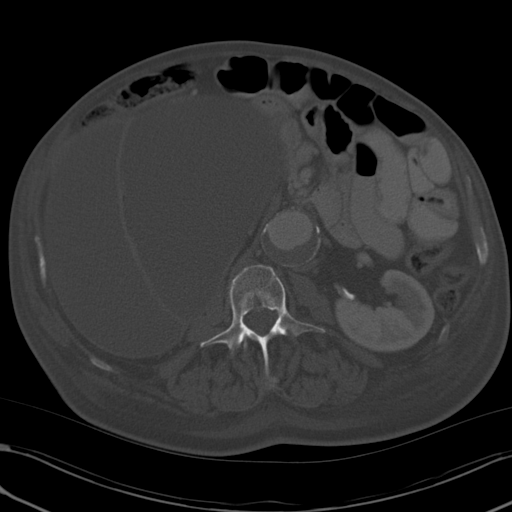
[im 66/87  soft-tissue]
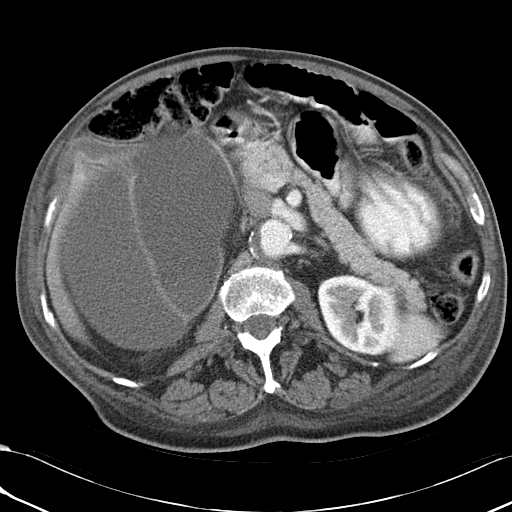
[im 76/87  soft-tissue]
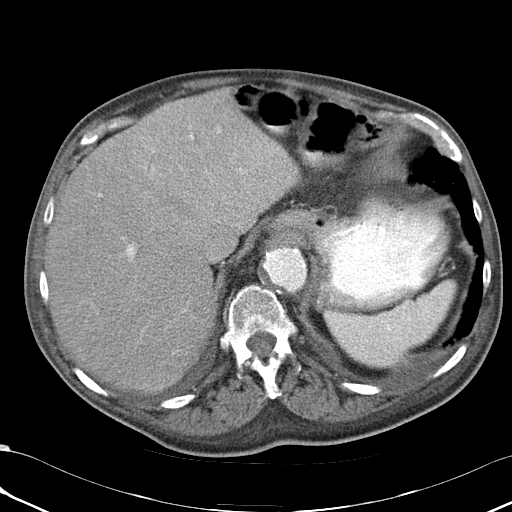
[im 81/87  soft-tissue]
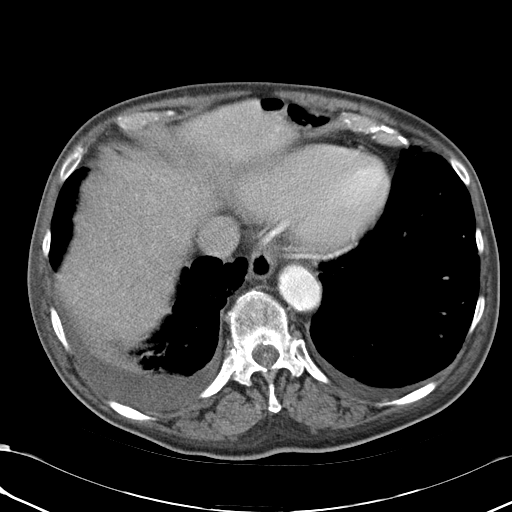

[Series 4: abd_pel_with 3.0 spo · coronal · 0.74mm/px · 3 of 96 slices shown]
[im 32/96  soft-tissue]
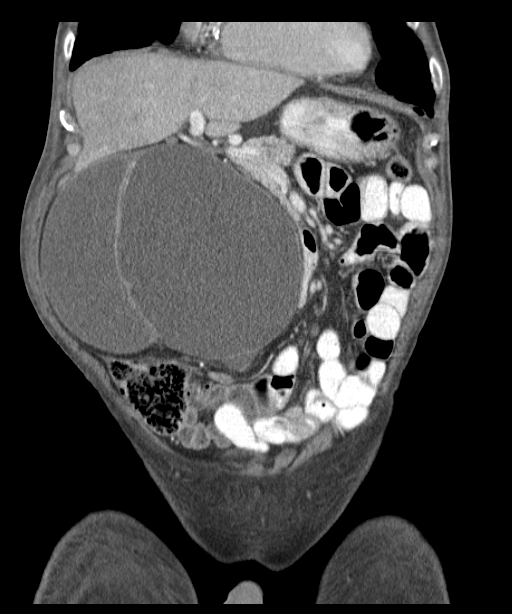
[im 43/96  soft-tissue]
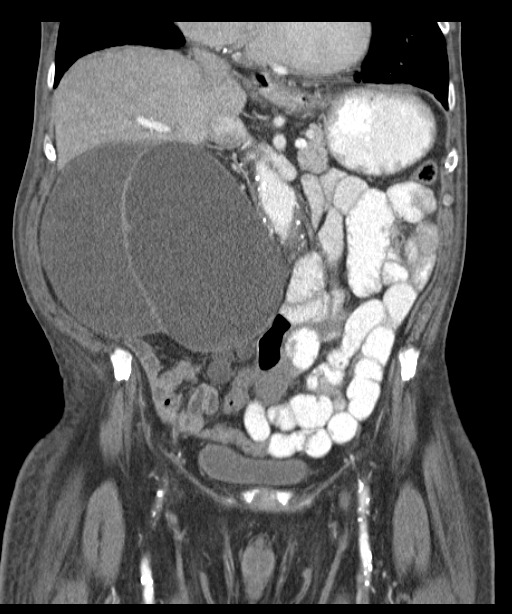
[im 53/96  soft-tissue]
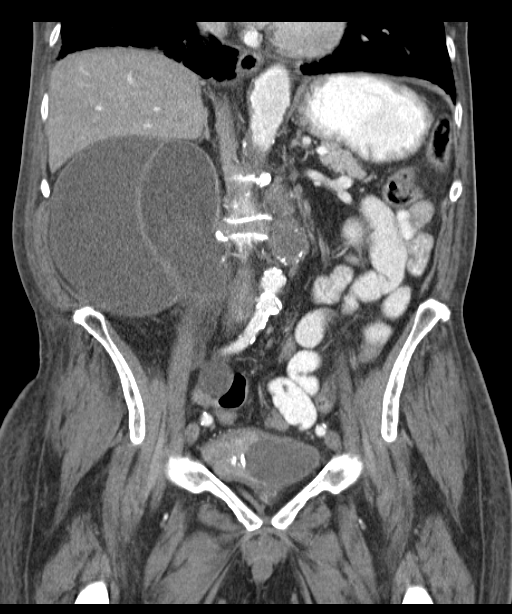

[15 of 46 positions shown; findings below may reference images not displayed]

FINDINGS: Small bibasilar pleural effusions.
Emphysematous changes with basilar interstitial changes and
question scarring.
Several tiny nodular areas in the periphery of the lungs may
represent scarring though pulmonary nodules not excluded, up to 5
mm in size.
Extensive atherosclerotic calcifications aorta and coronary
arteries with aneurysmal dilatation of the infrarenal abdominal
aorta measuring up to 4.0 x 3.6 cm image 34 extending into the
cm length.
Significant eccentric thrombus is identified.
No extension of the aneurysm into bifurcation.

No abnormal appearing right kidney or renal cortex is identified.
In right renal fossa a large cystic mass is identified measuring
19.8 x 16.9 x 15.2 cm in size compatible with a markedly
hydronephrotic right renal collecting system.
Marked dilatation of right ureter up to 2.6 cm diameter at
midportion.
Soft tissue mass with irregular dense central calcification
identified at the right bladder base obstructing ureterovesicle
junction, mass measuring 6.2 x 3.1 x 3.7 cm in size.
Additional enhancement and irregularity are seen along the distal
4.8 cm of the right ureter likely representing ureteral tumor
extension.

Liver, spleen, pancreas, left kidney, and adrenal glands normal
appearance.
No definite peri vesicular tumor extension or pelvic adenopathy.
Left ureter normal in caliber.
Prominent stool in rectum.
Stomach and bowel loops otherwise normal appearance.
No adenopathy, free fluid, or acute osseous findings.
IMPRESSION: 6.2 x 3.1 x 3.7 cm diameter partially calcified right bladder base
mass consistent with bladder neoplasm, likely extending up the
distal 4.8 cm of the right ureter.
Associated right ureteral obstruction at the ureterovesicle
junction causing marked right hydroureter nephrosis.
No definite evidence of adenopathy or metastatic disease.
4.0 x 3.6 x 7.9 cm infrarenal abdominal aortic aneurysm.
Basilar COPD changes with bilateral pleural effusions, fibrosis,
and potential scarring though small nodular foci up to 5 mm
diameter not excluded; due to presence of a bladder neoplasm,
recommend CT chest with contrast for further evaluation.

Findings called to Dr. Dragonas on 05/31/2012 at 2111 hours.
